# Patient Record
Sex: Female | Born: 1980 | Race: Black or African American | Hispanic: No | Marital: Single | Smoking: Never smoker
Health system: Southern US, Community
[De-identification: ages and names within clinical notes are randomized; demographics above are authoritative.]

## PROBLEM LIST (undated history)

## (undated) DIAGNOSIS — G479 Sleep disorder, unspecified: Secondary | ICD-10-CM

## (undated) DIAGNOSIS — J189 Pneumonia, unspecified organism: Secondary | ICD-10-CM

## (undated) DIAGNOSIS — D219 Benign neoplasm of connective and other soft tissue, unspecified: Secondary | ICD-10-CM

## (undated) DIAGNOSIS — N926 Irregular menstruation, unspecified: Secondary | ICD-10-CM

## (undated) DIAGNOSIS — L309 Dermatitis, unspecified: Secondary | ICD-10-CM

## (undated) DIAGNOSIS — D649 Anemia, unspecified: Secondary | ICD-10-CM

## (undated) HISTORY — DX: Benign neoplasm of connective and other soft tissue, unspecified: D21.9

---

## 2007-09-02 ENCOUNTER — Ambulatory Visit: Payer: Self-pay | Admitting: Internal Medicine

## 2007-10-14 ENCOUNTER — Ambulatory Visit: Payer: Self-pay | Admitting: Family Medicine

## 2007-10-15 ENCOUNTER — Encounter (INDEPENDENT_AMBULATORY_CARE_PROVIDER_SITE_OTHER): Payer: Self-pay | Admitting: Family Medicine

## 2007-10-15 LAB — CONVERTED CEMR LAB
ALT: 21 units/L (ref 0–35)
AST: 27 units/L (ref 0–37)
Basophils Absolute: 0 10*3/uL (ref 0.0–0.1)
Basophils Relative: 0 % (ref 0–1)
Calcium: 9.3 mg/dL (ref 8.4–10.5)
Chloride: 106 meq/L (ref 96–112)
Creatinine, Ser: 0.68 mg/dL (ref 0.40–1.20)
Hemoglobin: 6.7 g/dL — CL (ref 12.0–15.0)
MCHC: 26.3 g/dL — ABNORMAL LOW (ref 30.0–36.0)
Monocytes Absolute: 0.3 10*3/uL (ref 0.1–1.0)
Neutro Abs: 2.5 10*3/uL (ref 1.7–7.7)
Neutrophils Relative %: 54 % (ref 43–77)
RDW: 18.7 % — ABNORMAL HIGH (ref 11.5–15.5)

## 2007-10-19 ENCOUNTER — Ambulatory Visit: Payer: Self-pay | Admitting: *Deleted

## 2008-04-11 ENCOUNTER — Encounter: Admission: RE | Admit: 2008-04-11 | Discharge: 2008-04-11 | Payer: Self-pay | Admitting: Family Medicine

## 2009-03-20 HISTORY — PX: MYOMECTOMY: SHX85

## 2009-03-23 ENCOUNTER — Inpatient Hospital Stay (HOSPITAL_COMMUNITY): Admission: RE | Admit: 2009-03-23 | Discharge: 2009-03-25 | Payer: Self-pay | Admitting: Obstetrics & Gynecology

## 2009-03-23 ENCOUNTER — Encounter: Payer: Self-pay | Admitting: Obstetrics & Gynecology

## 2010-02-07 IMAGING — US US TRANSVAGINAL NON-OB
1 series · 14 of 25 positions shown · non-contrast
Comparison: None.

CLINICAL DATA: Enlarged uterus.  Evaluate for pelvic mass.

TRANSABDOMINAL ULTRASOUND OF PELVIS
TECHNIQUE: Transabdominal ultrasound examination of the pelvis was
performed including evaluation of the uterus, ovaries, adnexal
regions, and pelvic cul-de-sac.

[Series 1: us transvaginal non-ob · 0.43mm/px · 14 of 38 slices shown]
[im 1/38]
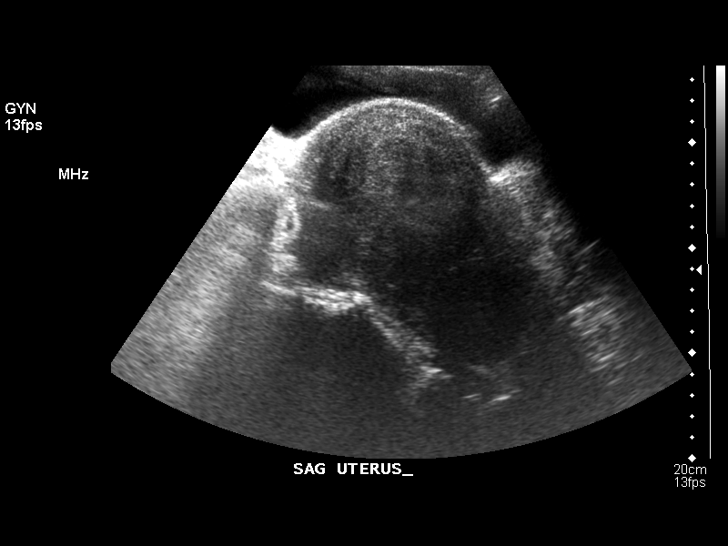
[im 4/38]
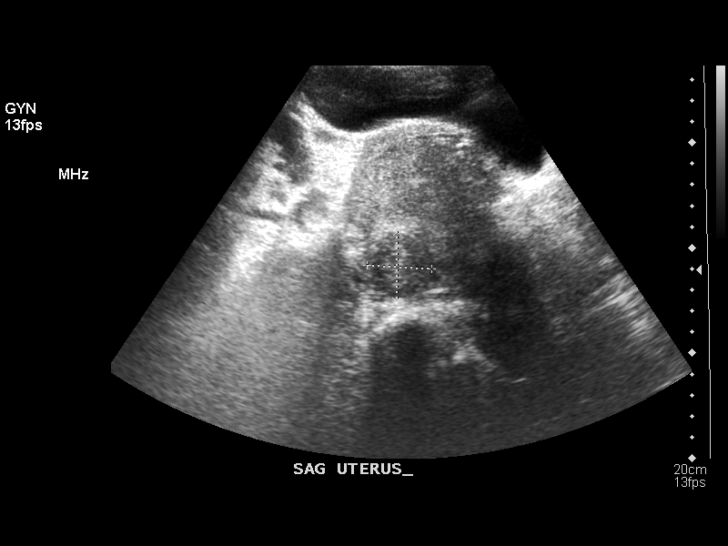
[im 7/38]
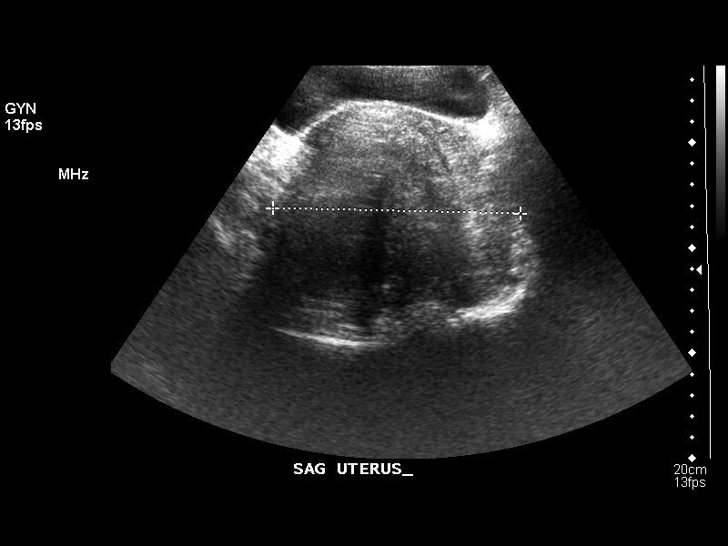
[im 10/38]
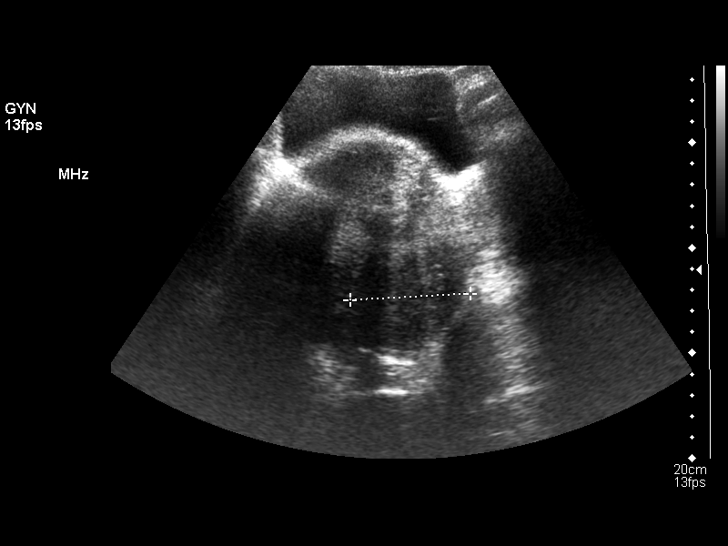
[im 13/38]
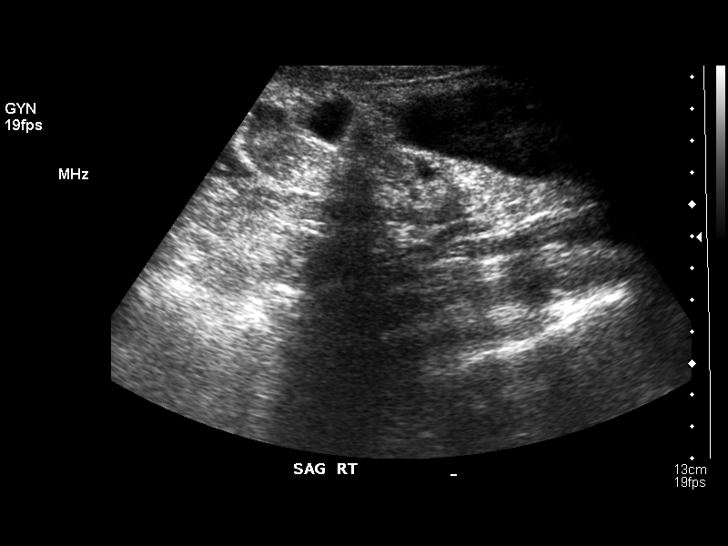
[im 14/38]
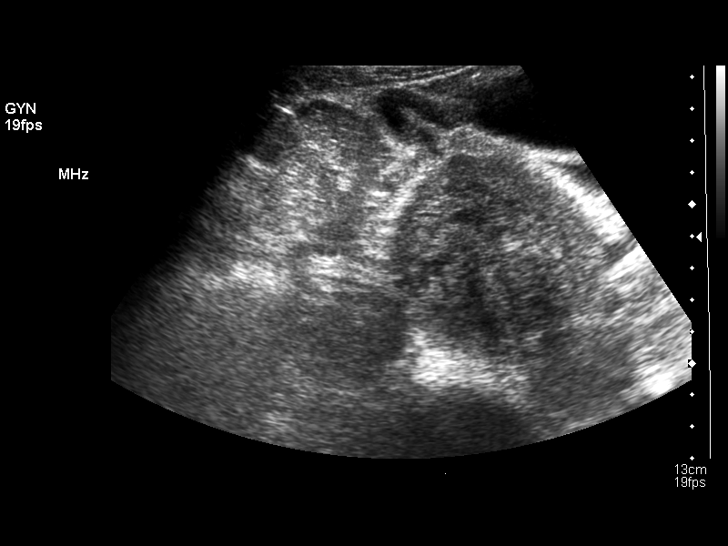
[im 17/38]
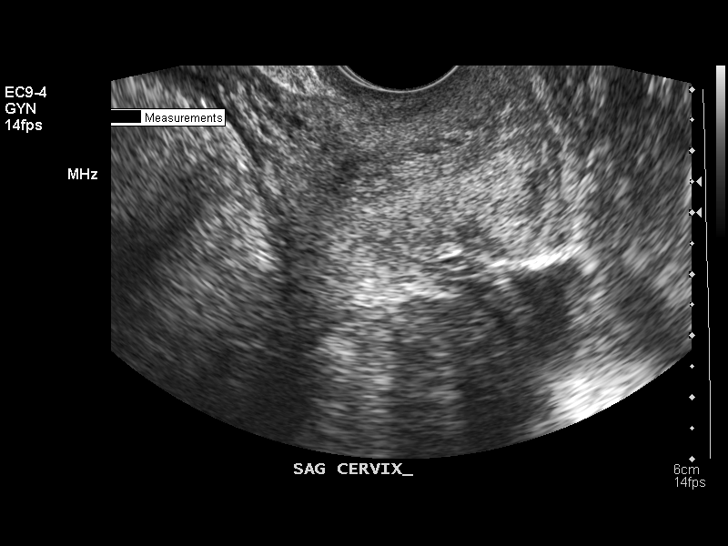
[im 21/38]
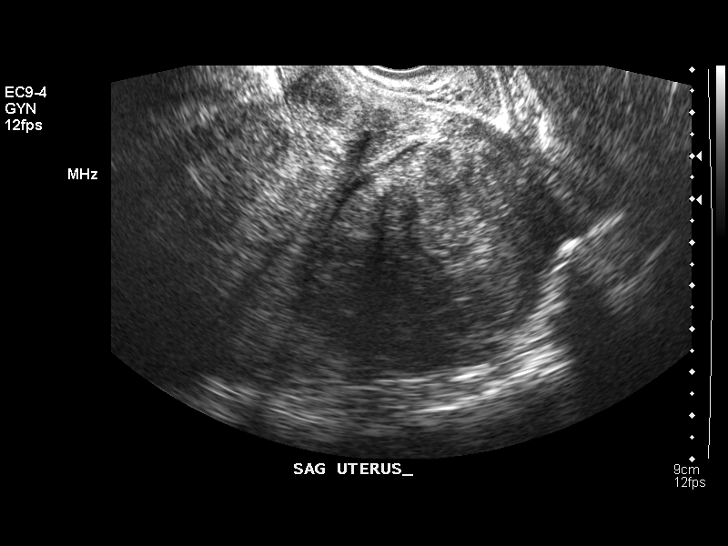
[im 24/38]
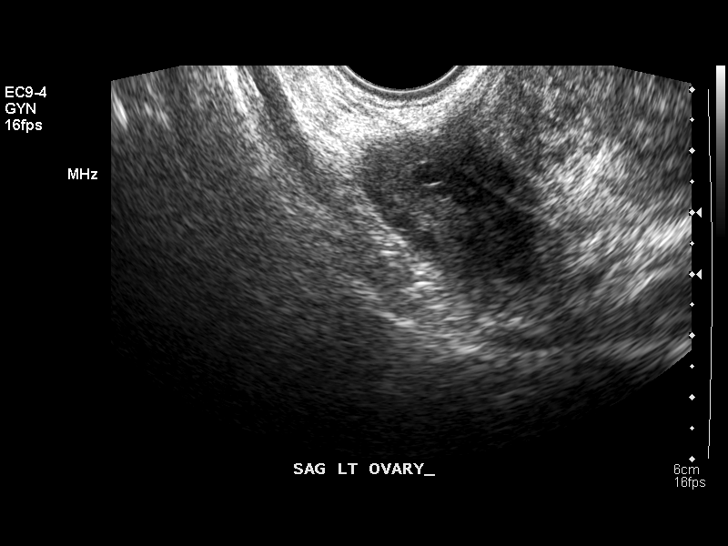
[im 25/38]
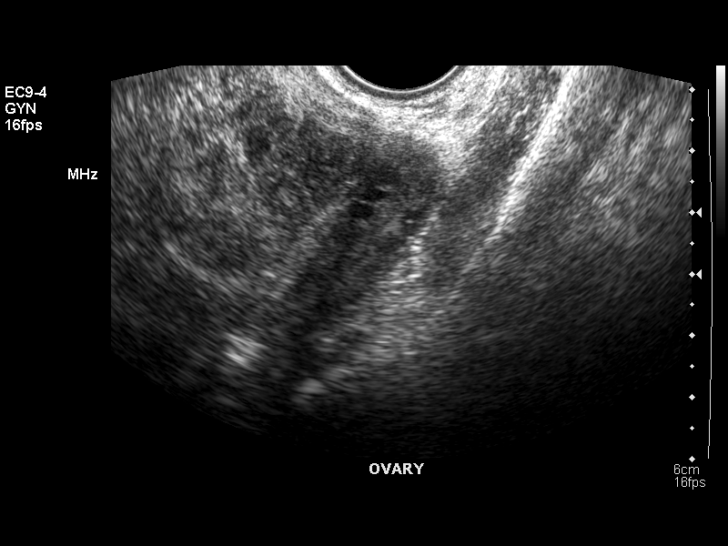
[im 28/38]
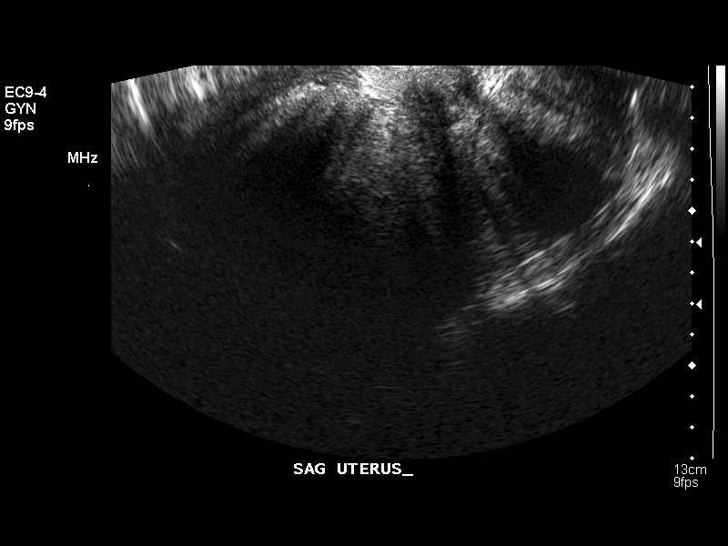
[im 31/38]
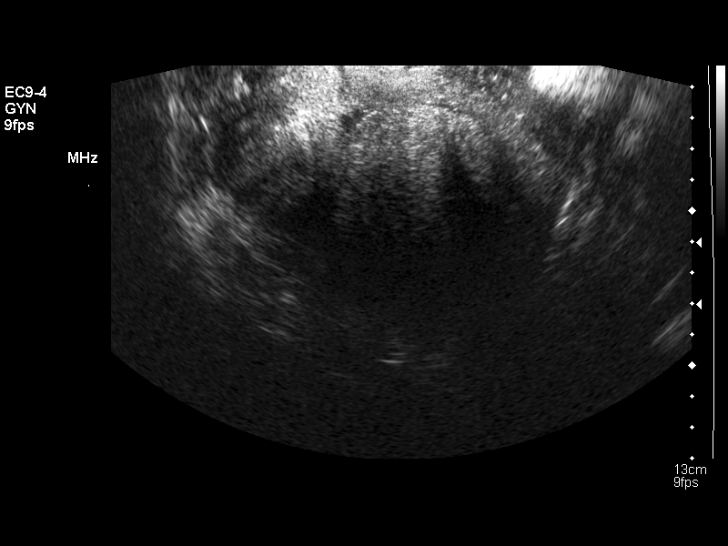
[im 34/38]
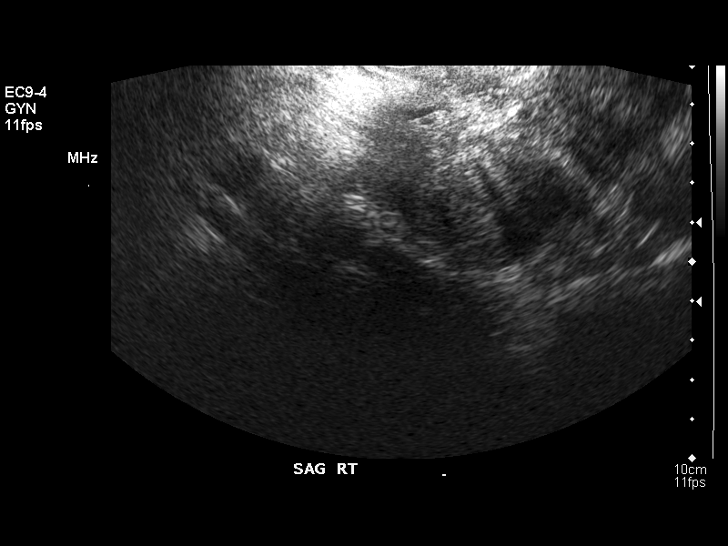
[im 38/38]
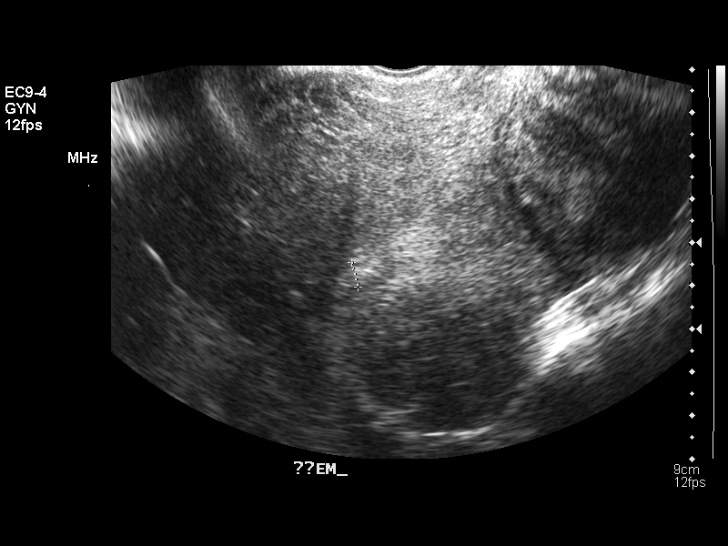

[14 of 25 positions shown; findings below may reference images not displayed]

FINDINGS: The uterus measures 14.8 x 9.4 x 11.8 cm containing
multiple fibroids.  Largest is located in the fundal position
measuring 8.6 x 8.4 x 10 cm.  Right posterior body fibroid measures
3.1 x 3.3 x 3.9 cm.  Lower right fibroid measures 5.1 x 5.2 x
cm.  Anterior left fibroid measures 1.9 x 1.7 x 3 cm.

Endometrial lining is poorly delineated and appears distorted by
the fibroids.

Right ovary not visualized.  Left ovary measures 3.3 x 1.7 x 1.5 cm
containing small follicles without a dominant mass.  No free fluid.
IMPRESSION: Multiple uterine fibroids largest measuring up to 10 cm as
described above.

Poor delineation of the endometrial lining.

Nonvisualization right ovary.

## 2010-05-09 LAB — CBC
HCT: 27.7 % — ABNORMAL LOW (ref 36.0–46.0)
Hemoglobin: 9.1 g/dL — ABNORMAL LOW (ref 12.0–15.0)
MCV: 78.8 fL (ref 78.0–100.0)
RBC: 3.47 MIL/uL — ABNORMAL LOW (ref 3.87–5.11)
RBC: 5.07 MIL/uL (ref 3.87–5.11)
WBC: 10.2 10*3/uL (ref 4.0–10.5)
WBC: 5.1 10*3/uL (ref 4.0–10.5)

## 2010-05-09 LAB — PREGNANCY, URINE: Preg Test, Ur: NEGATIVE

## 2010-05-09 LAB — ABO/RH: ABO/RH(D): A NEG

## 2010-05-09 LAB — TYPE AND SCREEN: Antibody Screen: NEGATIVE

## 2010-06-13 ENCOUNTER — Other Ambulatory Visit: Payer: Self-pay | Admitting: Podiatry

## 2012-06-17 ENCOUNTER — Ambulatory Visit: Payer: Self-pay | Admitting: Obstetrics & Gynecology

## 2012-07-14 ENCOUNTER — Ambulatory Visit: Payer: Self-pay | Admitting: Obstetrics & Gynecology

## 2012-07-29 ENCOUNTER — Ambulatory Visit: Payer: Self-pay | Admitting: Obstetrics & Gynecology

## 2012-08-05 ENCOUNTER — Ambulatory Visit: Payer: Self-pay | Admitting: Obstetrics & Gynecology

## 2012-08-12 ENCOUNTER — Ambulatory Visit (INDEPENDENT_AMBULATORY_CARE_PROVIDER_SITE_OTHER): Payer: 59 | Admitting: Obstetrics & Gynecology

## 2012-08-12 ENCOUNTER — Encounter: Payer: Self-pay | Admitting: Obstetrics & Gynecology

## 2012-08-12 VITALS — BP 108/75 | HR 69 | Temp 98.2°F | Ht 69.0 in | Wt 145.0 lb

## 2012-08-12 DIAGNOSIS — N926 Irregular menstruation, unspecified: Secondary | ICD-10-CM

## 2012-08-12 DIAGNOSIS — N939 Abnormal uterine and vaginal bleeding, unspecified: Secondary | ICD-10-CM

## 2012-08-12 DIAGNOSIS — D259 Leiomyoma of uterus, unspecified: Secondary | ICD-10-CM

## 2012-08-12 LAB — HEMOGLOBIN AND HEMATOCRIT, BLOOD
HCT: 34.5 % — ABNORMAL LOW (ref 36.0–46.0)
Hemoglobin: 11.8 g/dL — ABNORMAL LOW (ref 12.0–15.0)

## 2012-08-12 MED ORDER — NORETHIN-ETH ESTRAD-FE BIPHAS 1 MG-10 MCG / 10 MCG PO TABS: 1.0000 | ORAL_TABLET | Freq: Every day | ORAL | Status: DC

## 2012-08-12 NOTE — Patient Instructions (Signed)
Levonorgestrel intrauterine device (IUD) What is this medicine? LEVONORGESTREL IUD (LEE voe nor jes trel) is a contraceptive (birth control) device. The device is placed inside the uterus by a healthcare professional. It is used to prevent pregnancy and can also be used to treat heavy bleeding that occurs during your period. Depending on the device, it can be used for 3 to 5 years. This medicine may be used for other purposes; ask your health care provider or pharmacist if you have questions. What should I tell my health care provider before I take this medicine? They need to know if you have any of these conditions: -abnormal Pap smear -cancer of the breast, uterus, or cervix -diabetes -endometritis -genital or pelvic infection now or in the past -have more than one sexual partner or your partner has more than one partner -heart disease -history of an ectopic or tubal pregnancy -immune system problems -IUD in place -liver disease or tumor -problems with blood clots or take blood-thinners -use intravenous drugs -uterus of unusual shape -vaginal bleeding that has not been explained -an unusual or allergic reaction to levonorgestrel, other hormones, silicone, or polyethylene, medicines, foods, dyes, or preservatives -pregnant or trying to get pregnant -breast-feeding How should I use this medicine? This device is placed inside the uterus by a health care professional. Talk to your pediatrician regarding the use of this medicine in children. Special care may be needed. Overdosage: If you think you have taken too much of this medicine contact a poison control center or emergency room at once. NOTE: This medicine is only for you. Do not share this medicine with others. What if I miss a dose? This does not apply. What may interact with this medicine? Do not take this medicine with any of the following medications: -amprenavir -bosentan -fosamprenavir This medicine may also interact with  the following medications: -aprepitant -barbiturate medicines for inducing sleep or treating seizures -bexarotene -griseofulvin -medicines to treat seizures like carbamazepine, ethotoin, felbamate, oxcarbazepine, phenytoin, topiramate -modafinil -pioglitazone -rifabutin -rifampin -rifapentine -some medicines to treat HIV infection like atazanavir, indinavir, lopinavir, nelfinavir, tipranavir, ritonavir -St. John's wort -warfarin This list may not describe all possible interactions. Give your health care provider a list of all the medicines, herbs, non-prescription drugs, or dietary supplements you use. Also tell them if you smoke, drink alcohol, or use illegal drugs. Some items may interact with your medicine. What should I watch for while using this medicine? Visit your doctor or health care professional for regular check ups. See your doctor if you or your partner has sexual contact with others, becomes HIV positive, or gets a sexual transmitted disease. This product does not protect you against HIV infection (AIDS) or other sexually transmitted diseases. You can check the placement of the IUD yourself by reaching up to the top of your vagina with clean fingers to feel the threads. Do not pull on the threads. It is a good habit to check placement after each menstrual period. Call your doctor right away if you feel more of the IUD than just the threads or if you cannot feel the threads at all. The IUD may come out by itself. You may become pregnant if the device comes out. If you notice that the IUD has come out use a backup birth control method like condoms and call your health care provider. Using tampons will not change the position of the IUD and are okay to use during your period. What side effects may I notice from receiving this medicine?   Side effects that you should report to your doctor or health care professional as soon as possible: -allergic reactions like skin rash, itching or  hives, swelling of the face, lips, or tongue -fever, flu-like symptoms -genital sores -high blood pressure -no menstrual period for 6 weeks during use -pain, swelling, warmth in the leg -pelvic pain or tenderness -severe or sudden headache -signs of pregnancy -stomach cramping -sudden shortness of breath -trouble with balance, talking, or walking -unusual vaginal bleeding, discharge -yellowing of the eyes or skin Side effects that usually do not require medical attention (report to your doctor or health care professional if they continue or are bothersome): -acne -breast pain -change in sex drive or performance -changes in weight -cramping, dizziness, or faintness while the device is being inserted -headache -irregular menstrual bleeding within first 3 to 6 months of use -nausea This list may not describe all possible side effects. Call your doctor for medical advice about side effects. You may report side effects to FDA at 1-800-FDA-1088. Where should I keep my medicine? This does not apply. NOTE: This sheet is a summary. It may not cover all possible information. If you have questions about this medicine, talk to your doctor, pharmacist, or health care provider.  2013, [employer]. (03/06/2011 1:54:04 PM)  

## 2012-08-12 NOTE — Progress Notes (Signed)
Subjective:     Donna Orozco is a 32 y.o. female here for follow up on fibroids.  Current complaints: blood clots with cycles x 3 months.  Personal health questionnaire reviewed: not asked.   Gynecologic History Patient's last menstrual period was 08/01/2012. Contraception: OCP (estrogen/progesterone) Last Pap: 04/25/2010. Results were: normal Last mammogram: ? 2012. Results were: normal  Obstetric History OB History   Grav Para Term Preterm Abortions TAB SAB Ect Mult Living   0 0 0 0 0 0 0 0 0 0        The following portions of the patient's history were reviewed and updated as appropriate: allergies, current medications, past family history, past medical history, past social history, past surgical history and problem list.  Review of Systems Pertinent items are noted in HPI.    Objective:    Pelvic: uterus approximately 12 weeks size, irregular contour    Assessment:   AUB--L  Plan:   Treatment options reviewed COCP for now Keep menstrual calendar Return in few months

## 2012-08-13 DIAGNOSIS — N939 Abnormal uterine and vaginal bleeding, unspecified: Secondary | ICD-10-CM | POA: Insufficient documentation

## 2012-08-13 DIAGNOSIS — D259 Leiomyoma of uterus, unspecified: Secondary | ICD-10-CM | POA: Insufficient documentation

## 2012-09-02 ENCOUNTER — Encounter: Payer: Self-pay | Admitting: Obstetrics & Gynecology

## 2012-09-02 ENCOUNTER — Ambulatory Visit (INDEPENDENT_AMBULATORY_CARE_PROVIDER_SITE_OTHER): Payer: Managed Care, Other (non HMO) | Admitting: Obstetrics & Gynecology

## 2012-09-02 VITALS — BP 126/77 | HR 71 | Temp 98.3°F | Wt 146.2 lb

## 2012-09-02 DIAGNOSIS — D259 Leiomyoma of uterus, unspecified: Secondary | ICD-10-CM

## 2012-09-02 LAB — HEMOGLOBIN AND HEMATOCRIT, BLOOD
HCT: 32.5 % — ABNORMAL LOW (ref 36.0–46.0)
Hemoglobin: 10.8 g/dL — ABNORMAL LOW (ref 12.0–15.0)

## 2012-09-02 NOTE — Progress Notes (Signed)
.   Subjective:     Donna Orozco is a 32 y.o. female here for a follow up exam from her last visit.  Current complaints: She received a script for Lo Loestrin 3 weeks ago and started taking them.  She started bleeding heavy two days later and Dr. Clearance Coots told her to take 3 pills a day and then restart and take one a day.  She is still lightly spotting.  Personal health questionnaire reviewed: yes.   Gynecologic History Patient's last menstrual period was 08/01/2012. 08/15/2012. Contraception: OCP (estrogen/progesterone) Last Pap: 2012. Results were: normal Last mammogram: 2012. Results were: normal  Obstetric History OB History   Grav Para Term Preterm Abortions TAB SAB Ect Mult Living   0 0 0 0 0 0 0 0 0 0        The following portions of the patient's history were reviewed and updated as appropriate: allergies, current medications, past family history, past medical history, past social history, past surgical history and problem list.  Review of Systems Pertinent items are noted in HPI.    Objective:     No exam today     Assessment:   Uterine fibroids Unscheduled bleeding on COCP  Plan:     COCP BID x 3 months Return in a few months

## 2012-09-06 ENCOUNTER — Encounter: Payer: Self-pay | Admitting: Obstetrics & Gynecology

## 2012-09-08 ENCOUNTER — Encounter: Payer: Self-pay | Admitting: Obstetrics & Gynecology

## 2012-09-09 ENCOUNTER — Encounter: Payer: Self-pay | Admitting: Obstetrics & Gynecology

## 2012-09-22 ENCOUNTER — Ambulatory Visit: Payer: Self-pay | Admitting: Obstetrics & Gynecology

## 2012-11-04 ENCOUNTER — Ambulatory Visit (INDEPENDENT_AMBULATORY_CARE_PROVIDER_SITE_OTHER): Payer: Managed Care, Other (non HMO) | Admitting: Obstetrics & Gynecology

## 2012-11-04 VITALS — BP 105/72 | HR 79 | Temp 98.7°F

## 2012-11-04 DIAGNOSIS — D259 Leiomyoma of uterus, unspecified: Secondary | ICD-10-CM

## 2012-11-04 NOTE — Progress Notes (Signed)
Subjective:     Donna Orozco is a 32 y.o. female here for a routine exam.  Current complaints: Patient is in the office today to follow up on present treatment. Patient is using Lo Loestrin- patient reports she has had a fairly heavy flow with some blood clots. Patient states she feels her cycle is about to end now. Patient has history of heavy bleeding. ( patient had adverse affect to Premarin/Provera)   Personal health questionnaire reviewed: no.   Gynecologic History No LMP recorded. Contraception: OCP (estrogen/progesterone)   Obstetric History OB History  Gravida Para Term Preterm AB SAB TAB Ectopic Multiple Living  0 0 0 0 0 0 0 0 0 0          The following portions of the patient's history were reviewed and updated as appropriate: allergies, current medications, past family history, past medical history, past social history, past surgical history and problem list.  Review of Systems Pertinent items are noted in HPI.    Objective:     No exam today     Assessment:   AUB-L, ?O  Plan:    A therapeutic/diagnostic D&C is planned

## 2012-11-06 ENCOUNTER — Encounter: Payer: Self-pay | Admitting: Obstetrics & Gynecology

## 2012-11-06 NOTE — Patient Instructions (Signed)
Dilation and Curettage or Vacuum Curettage Dilation and curettage (D&C) and vacuum curettage are minor procedures. A D&C involves stretching (dilation) the cervix and scraping (curettage) the inside lining of the womb (uterus). During a D&C, tissue is gently scraped from the inside lining of the uterus. During a vacuum curettage, the lining and tissue in the uterus are removed with the use of gentle suction. Curettage may be performed for diagnostic or therapeutic purposes. As a diagnostic procedure, curettage is performed for the purpose of examining tissues from the uterus. Tissue examination may help determine causes or treatment options for symptoms. A diagnostic curettage may be performed for the following symptoms:  Irregular bleeding in the uterus.  Bleeding with the development of clots.  Spotting between menstrual periods.  Prolonged menstrual periods.  Bleeding after menopause.  No menstrual period (amenorrhea).  A change in size and shape of the uterus. A therapeutic curettage is performed to remove tissue, blood, or a contraceptive device. Therapeutic curettage may be performed for the following conditions:   Removal of an IUD (intrauterine device).  Removal of retained placenta after giving birth. Retained placenta can cause bleeding severe enough to require transfusions or an infection.  Abortion.  Miscarriage.  Removal of polyps inside the uterus.  Removal of uncommon types of fibroids (noncancerous lumps). LET YOUR CAREGIVER KNOW ABOUT:   Allergies to food or medicine.  Medicines taken, including vitamins, herbs, eyedrops, over-the-counter medicines, and creams.  Use of steroids (by mouth or creams).  Previous problems with anesthetics or numbing medicines.  History of bleeding problems or blood clots.  Previous surgery.  Other health problems, including diabetes and kidney problems.  Possibility of pregnancy, if this applies. RISKS AND COMPLICATIONS    Excessive bleeding.  Infection of the uterus.  Damage to the cervix.  Development of scar tissue (adhesions) inside the uterus, later causing abnormal amounts of menstrual bleeding.  Complications from the general anesthetic, if a general anesthetic is used.  Putting a hole (perforation) in the uterus. This is rare. BEFORE THE PROCEDURE   Eat and drink before the procedure only as directed by your caregiver.  Arrange for someone to take you home. PROCEDURE   This procedure may be done in a hospital, outpatient clinic, or caregiver's office.  You may be given a general anesthetic or a local anesthetic in and around the cervix.  You will lie on your back with your legs in stirrups.  There are two ways in which your cervix can be softened and dilated. These include:  Taking a medicine.  Having thin rods (laminaria) inserted into your cervix.  A curved tool (curette) will scrape cells from the inside lining of the uterus and will then be removed. This procedure usually takes about 15 to 30 minutes. AFTER THE PROCEDURE   You will rest in the recovery area until you are stable and are ready to go home.  You will need to have someone take you home.  You may feel sick to your stomach (nauseous) or throw up (vomit) if you had general anesthesia.  You may have a sore throat if a tube was placed in your throat during general anesthesia.  You may have light cramping and bleeding for 2 days to 2 weeks after the procedure.  Your uterus needs to make a new lining after the procedure. This may make your next period late. Document Released: 02/03/2005 Document Revised: 04/28/2011 Document Reviewed: 09/01/2008 [facility] Patient Information 2014 [place], [place].

## 2012-11-08 ENCOUNTER — Other Ambulatory Visit: Payer: Self-pay | Admitting: *Deleted

## 2012-11-09 ENCOUNTER — Encounter (HOSPITAL_COMMUNITY): Payer: Self-pay | Admitting: Pharmacist

## 2012-11-12 ENCOUNTER — Ambulatory Visit (HOSPITAL_COMMUNITY): Payer: PRIVATE HEALTH INSURANCE | Admitting: Anesthesiology

## 2012-11-12 ENCOUNTER — Encounter (HOSPITAL_COMMUNITY): Payer: Self-pay | Admitting: *Deleted

## 2012-11-12 ENCOUNTER — Ambulatory Visit (HOSPITAL_COMMUNITY)
Admission: RE | Admit: 2012-11-12 | Discharge: 2012-11-12 | Disposition: A | Discharge status: Home / Self Care | Payer: PRIVATE HEALTH INSURANCE | Source: Ambulatory Visit | Attending: Obstetrics & Gynecology | Admitting: Obstetrics & Gynecology

## 2012-11-12 ENCOUNTER — Encounter (HOSPITAL_COMMUNITY)
Admission: RE | Disposition: A | Discharge status: Home / Self Care | Payer: Self-pay | Source: Ambulatory Visit | Attending: Obstetrics & Gynecology

## 2012-11-12 ENCOUNTER — Encounter (HOSPITAL_COMMUNITY): Payer: Self-pay | Admitting: Anesthesiology

## 2012-11-12 DIAGNOSIS — N920 Excessive and frequent menstruation with regular cycle: Secondary | ICD-10-CM

## 2012-11-12 DIAGNOSIS — N938 Other specified abnormal uterine and vaginal bleeding: Secondary | ICD-10-CM | POA: Insufficient documentation

## 2012-11-12 DIAGNOSIS — D259 Leiomyoma of uterus, unspecified: Secondary | ICD-10-CM

## 2012-11-12 DIAGNOSIS — N949 Unspecified condition associated with female genital organs and menstrual cycle: Secondary | ICD-10-CM | POA: Insufficient documentation

## 2012-11-12 DIAGNOSIS — Z30430 Encounter for insertion of intrauterine contraceptive device: Secondary | ICD-10-CM

## 2012-11-12 DIAGNOSIS — D250 Submucous leiomyoma of uterus: Secondary | ICD-10-CM | POA: Insufficient documentation

## 2012-11-12 DIAGNOSIS — N939 Abnormal uterine and vaginal bleeding, unspecified: Secondary | ICD-10-CM

## 2012-11-12 HISTORY — PX: HYSTEROSCOPY W/D&C: SHX1775

## 2012-11-12 HISTORY — DX: Anemia, unspecified: D64.9

## 2012-11-12 LAB — CBC
HCT: 36.3 % (ref 36.0–46.0)
Hemoglobin: 11.9 g/dL — ABNORMAL LOW (ref 12.0–15.0)
MCHC: 32.8 g/dL (ref 30.0–36.0)
Platelets: 245 10*3/uL (ref 150–400)
RBC: 4.66 MIL/uL (ref 3.87–5.11)
WBC: 5.2 10*3/uL (ref 4.0–10.5)

## 2012-11-12 LAB — PREGNANCY, URINE: Preg Test, Ur: NEGATIVE

## 2012-11-12 SURGERY — DILATATION AND CURETTAGE /HYSTEROSCOPY
Anesthesia: General | Site: Uterus | Wound class: Clean Contaminated

## 2012-11-12 MED ORDER — FENTANYL CITRATE 0.05 MG/ML IJ SOLN: 25.0000 ug | INTRAMUSCULAR | Status: DC | PRN

## 2012-11-12 MED ORDER — ONDANSETRON HCL 4 MG/2ML IJ SOLN
INTRAMUSCULAR | Status: AC
  Filled 2012-11-12: qty 2

## 2012-11-12 MED ORDER — GLYCOPYRROLATE 0.2 MG/ML IJ SOLN
INTRAMUSCULAR | Status: AC
  Filled 2012-11-12: qty 1

## 2012-11-12 MED ORDER — MEPERIDINE HCL 25 MG/ML IJ SOLN: 6.2500 mg | INTRAMUSCULAR | Status: DC | PRN

## 2012-11-12 MED ORDER — LIDOCAINE HCL 1 % IJ SOLN
INTRAMUSCULAR | Status: DC | PRN
  Administered 2012-11-12: 10 mL

## 2012-11-12 MED ORDER — GLYCINE 1.5 % IR SOLN
Status: DC | PRN
  Administered 2012-11-12: 3000 mL

## 2012-11-12 MED ORDER — LIDOCAINE HCL (CARDIAC) 20 MG/ML IV SOLN
INTRAVENOUS | Status: AC
  Filled 2012-11-12: qty 5

## 2012-11-12 MED ORDER — MIDAZOLAM HCL 2 MG/2ML IJ SOLN
INTRAMUSCULAR | Status: AC
  Filled 2012-11-12: qty 2

## 2012-11-12 MED ORDER — KETOROLAC TROMETHAMINE 30 MG/ML IJ SOLN: 15.0000 mg | Freq: Once | INTRAMUSCULAR | Status: DC | PRN

## 2012-11-12 MED ORDER — FENTANYL CITRATE 0.05 MG/ML IJ SOLN
INTRAMUSCULAR | Status: DC | PRN
  Administered 2012-11-12 (×2): 50 ug via INTRAVENOUS

## 2012-11-12 MED ORDER — FENTANYL CITRATE 0.05 MG/ML IJ SOLN
INTRAMUSCULAR | Status: AC
  Filled 2012-11-12: qty 2

## 2012-11-12 MED ORDER — GLYCOPYRROLATE 0.2 MG/ML IJ SOLN
INTRAMUSCULAR | Status: DC | PRN
  Administered 2012-11-12: 0.2 mg via INTRAVENOUS

## 2012-11-12 MED ORDER — LACTATED RINGERS IV SOLN
INTRAVENOUS | Status: DC
  Administered 2012-11-12 (×2): via INTRAVENOUS

## 2012-11-12 MED ORDER — KETOROLAC TROMETHAMINE 30 MG/ML IJ SOLN
INTRAMUSCULAR | Status: AC
  Filled 2012-11-12: qty 1

## 2012-11-12 MED ORDER — OXYCODONE-ACETAMINOPHEN 2.5-325 MG PO TABS: 1.0000 | ORAL_TABLET | ORAL | Status: DC | PRN

## 2012-11-12 MED ORDER — LIDOCAINE HCL (CARDIAC) 20 MG/ML IV SOLN
INTRAVENOUS | Status: DC | PRN
  Administered 2012-11-12: 20 mg via INTRAVENOUS
  Administered 2012-11-12: 80 mg via INTRAVENOUS

## 2012-11-12 MED ORDER — PROPOFOL 10 MG/ML IV EMUL
INTRAVENOUS | Status: AC
  Filled 2012-11-12: qty 20

## 2012-11-12 MED ORDER — ONDANSETRON HCL 4 MG/2ML IJ SOLN: 4.0000 mg | Freq: Once | INTRAMUSCULAR | Status: DC | PRN

## 2012-11-12 MED ORDER — KETOROLAC TROMETHAMINE 30 MG/ML IJ SOLN
INTRAMUSCULAR | Status: DC | PRN
  Administered 2012-11-12: 30 mg via INTRAVENOUS

## 2012-11-12 MED ORDER — PROPOFOL 10 MG/ML IV BOLUS
INTRAVENOUS | Status: DC | PRN
  Administered 2012-11-12: 130 mg via INTRAVENOUS

## 2012-11-12 MED ORDER — MIDAZOLAM HCL 2 MG/2ML IJ SOLN
INTRAMUSCULAR | Status: DC | PRN
  Administered 2012-11-12: 2 mg via INTRAVENOUS

## 2012-11-12 MED ORDER — ONDANSETRON HCL 4 MG/2ML IJ SOLN
INTRAMUSCULAR | Status: DC | PRN
  Administered 2012-11-12: 4 mg via INTRAVENOUS

## 2012-11-12 SURGICAL SUPPLY — 16 items
CANISTER SUCTION 2500CC (MISCELLANEOUS) ×2 IMPLANT
CATH ROBINSON RED A/P 16FR (CATHETERS) ×2 IMPLANT
CLOTH BEACON ORANGE TIMEOUT ST (SAFETY) ×2 IMPLANT
CONTAINER PREFILL 10% NBF 60ML (FORM) ×4 IMPLANT
DRESSING TELFA 8X3 (GAUZE/BANDAGES/DRESSINGS) ×2 IMPLANT
ELECT REM PT RETURN 9FT ADLT (ELECTROSURGICAL) ×2
ELECTRODE REM PT RTRN 9FT ADLT (ELECTROSURGICAL) ×1 IMPLANT
GLOVE BIO SURGEON STRL SZ 6.5 (GLOVE) ×2 IMPLANT
GOWN STRL REIN XL XLG (GOWN DISPOSABLE) ×4 IMPLANT
NEEDLE SPNL 20GX3.5 QUINCKE YW (NEEDLE) IMPLANT
PACK HYSTEROSCOPY LF (CUSTOM PROCEDURE TRAY) ×2 IMPLANT
PAD OB MATERNITY 4.3X12.25 (PERSONAL CARE ITEMS) ×2 IMPLANT
SCRUB PCMX 4 OZ (MISCELLANEOUS) ×2 IMPLANT
TOWEL OR 17X24 6PK STRL BLUE (TOWEL DISPOSABLE) ×4 IMPLANT
WATER STERILE IRR 1000ML POUR (IV SOLUTION) ×2 IMPLANT
mirena ×2 IMPLANT

## 2012-11-12 NOTE — H&P (Signed)
  Chief Complaint: 32 y.o. gravida who presents for a D&C/hysteroscopy  Details of Present Illness: The patient carries a diagnosis of uterine fibroids with secondary abnormal uterine bleeding.  The bleeding has been refractory to attempts to manage medically.  Ht 5\' 9"  (1.753 m)  Wt 63.504 kg (140 lb)  BMI 20.67 kg/m2  Past Medical History  Diagnosis Date  . Fibroids    History   Social History  . Marital Status: Single    Spouse Name: N/A    Number of Children: 0  . Years of Education: N/A   Occupational History  . Not on file.   Social History Main Topics  . Smoking status: Never Smoker   . Smokeless tobacco: Never Used  . Alcohol Use: No  . Drug Use: No  . Sexual Activity: Yes    Partners: Male    Birth Control/ Protection: OCP   Other Topics Concern  . Not on file   Social History Narrative  . No narrative on file   Family History  Problem Relation Age of Onset  . Hypertension Mother   . Diabetes Sister   . Sarcoidosis Brother     ? testing stages  . Heart disease Maternal Grandmother   . Dementia Maternal Grandmother     Pertinent items are noted in HPI.  Pre-Op Diagnosis: Abnormal Uterine Bleeding 16109   Planned Procedure: Procedure(s): DILATATION AND CURETTAGE /HYSTEROSCOPY  I have reviewed the patient's history and have completed the physical exam and Donna Orozco is acceptable for surgery.  Roseanna Rainbow, MD 11/12/2012 9:43 AM

## 2012-11-12 NOTE — Anesthesia Postprocedure Evaluation (Signed)
Anesthesia Post Note  Patient: Donna Orozco  Procedure(s) Performed: Procedure(s) (LRB): DILATATION AND CURETTAGE /HYSTEROSCOPY (N/A)  Anesthesia type: General  Patient location: PACU  Post pain: Pain level controlled  Post assessment: Post-op Vital signs reviewed  Last Vitals:  Filed Vitals:   11/12/12 0954  BP: 119/84  Pulse: 65  Temp: 36.6 C  Resp: 20    Post vital signs: Reviewed  Level of consciousness: sedated  Complications: No apparent anesthesia complications

## 2012-11-12 NOTE — Op Note (Signed)
Preoperative diagnosis: Abnormal uterine bleeding, uterine fibroids  Postoperative diagnosis: Same, submucous myoma  Procedure: Diagnostic/therapeutic dilatation and curettage, hysteroscopy, Mirena intrauterine device insertion  Surgeon: Antionette Char A  Anesthesia: Laryngeal mask airway, paracervical block  Estimated blood loss: Minimal  Urine output: 50 ml  IV Fluids: per Anesthesiology  Complications: None  Specimen: PATHOLOGY  Operative Findings: There was a myoma with a submucous component involving the left wall of the uterine fundus.  The right tubal ostium was seen.  The left tubal ostium was obscured by the myoma.  The endometrium was overall thin.  A gritty texture was noted during the curettage.    Description of procedure:   The patient was taken to the operating room and placed on the operating table in the semi-lithotomy position in [place] stirrups.  Examination under anesthesia was performed.  The patient was prepped and draped in the usual manner.  After a time-out had been completed, a speculum was placed in the vagina.  The anterior lip of the cervix was grasped with a single-toothed tenaculum.    10 cc of 1% lidocaine were injected at 4 and 7 o'clock to produce a paracervical block.  The uterine cavity sounded to 11 cm.  The endocervical canal was dilated with Shawnie Pons dilators.  A 5 mm diagnostic hysteroscope with Glycine as the distending medium was used to perform a diagnostic hysteroscopy.  The findings are noted above.    The hysteroscope was removed.  A small, Sims curette was used to perform an endometrial curettage. The Mirena intrauterine device was inserted.   All the instruments were removed from the vagina.  Final instrument counts were correct.  The patient was taken to the PACU in stable condition.

## 2012-11-12 NOTE — Addendum Note (Signed)
Addendum created 11/12/12 1341 by Suella Grove, CRNA   Modules edited: Anesthesia Medication Administration

## 2012-11-12 NOTE — Anesthesia Preprocedure Evaluation (Signed)
Anesthesia Evaluation    Reviewed: Allergy & Precautions, H&P , NPO status , Patient's Chart, lab work & pertinent test results  Airway Mallampati: I TM Distance: >3 FB Neck ROM: full    Dental no notable dental hx. (+) Teeth Intact   Pulmonary neg pulmonary ROS,    Pulmonary exam normal       Cardiovascular negative cardio ROS      Neuro/Psych negative neurological ROS  negative psych ROS   GI/Hepatic negative GI ROS, Neg liver ROS,   Endo/Other  negative endocrine ROS  Renal/GU negative Renal ROS  negative genitourinary   Musculoskeletal negative musculoskeletal ROS (+)   Abdominal Normal abdominal exam  (+)   Peds  Hematology negative hematology ROS (+)   Anesthesia Other Findings   Reproductive/Obstetrics negative OB ROS                           Anesthesia Physical Anesthesia Plan  ASA: I  Anesthesia Plan: General   Post-op Pain Management:    Induction: Intravenous  Airway Management Planned: LMA  Additional Equipment:   Intra-op Plan:   Post-operative Plan:   Informed Consent: I have reviewed the patients History and Physical, chart, labs and discussed the procedure including the risks, benefits and alternatives for the proposed anesthesia with the patient or authorized representative who has indicated his/her understanding and acceptance.     Plan Discussed with: CRNA and Surgeon  Anesthesia Plan Comments:         Anesthesia Quick Evaluation  

## 2012-11-12 NOTE — Transfer of Care (Signed)
Immediate Anesthesia Transfer of Care Note  Patient: Donna Orozco  Procedure(s) Performed: Procedure(s) with comments: DILATATION AND CURETTAGE /HYSTEROSCOPY (N/A) - with insertion of mirena IUD  Patient Location: PACU  Anesthesia Type:General  Level of Consciousness: sedated  Airway & Oxygen Therapy: Patient Spontanous Breathing and Patient connected to nasal cannula oxygen  Post-op Assessment: Report given to PACU RN  Post vital signs: Reviewed  Complications: No apparent anesthesia complications

## 2012-11-15 ENCOUNTER — Encounter: Payer: Self-pay | Admitting: Obstetrics & Gynecology

## 2012-11-16 ENCOUNTER — Encounter (HOSPITAL_COMMUNITY): Payer: Self-pay | Admitting: Obstetrics & Gynecology

## 2012-12-13 ENCOUNTER — Encounter: Payer: Self-pay | Admitting: Obstetrics & Gynecology

## 2012-12-13 ENCOUNTER — Ambulatory Visit (INDEPENDENT_AMBULATORY_CARE_PROVIDER_SITE_OTHER): Payer: Managed Care, Other (non HMO) | Admitting: Obstetrics & Gynecology

## 2012-12-13 VITALS — BP 122/77 | HR 80 | Temp 98.3°F | Ht 69.0 in | Wt 140.2 lb

## 2012-12-13 DIAGNOSIS — D259 Leiomyoma of uterus, unspecified: Secondary | ICD-10-CM

## 2012-12-13 DIAGNOSIS — Z3202 Encounter for pregnancy test, result negative: Secondary | ICD-10-CM

## 2012-12-13 LAB — HEMOGLOBIN AND HEMATOCRIT, BLOOD: HCT: 35.5 % — ABNORMAL LOW (ref 36.0–46.0)

## 2012-12-13 MED ORDER — FLUCONAZOLE 150 MG PO TABS: 150.0000 mg | ORAL_TABLET | ORAL | Status: DC

## 2012-12-13 MED ORDER — NORETHINDRONE ACETATE 5 MG PO TABS: 5.0000 mg | ORAL_TABLET | Freq: Every day | ORAL | Status: DC

## 2012-12-13 NOTE — Progress Notes (Signed)
.   Subjective:     Donna Orozco is a 32 y.o. female here for a problem exam.  Current complaints: Patient had a hysteroscopy with a D&C on 11/12/12.  Since her procedure she has had light bleeding. She is also having vaginal itching and irritation for the last couple of days.   Personal health questionnaire reviewed: no.   Gynecologic History No LMP recorded. Contraception: none Last Pap: 04/2010. Results were: normal Last mammogram: N/A  Obstetric History OB History  Gravida Para Term Preterm AB SAB TAB Ectopic Multiple Living  0 0 0 0 0 0 0 0 0 0          The following portions of the patient's history were reviewed and updated as appropriate: allergies, current medications, past family history, past medical history, past social history, past surgical history and problem list.  Review of Systems Pertinent items are noted in HPI.    Objective:     SPEC: IUD strings present Bimanual:  Uterus RV, irregular contour, NT     Assessment:   Postoperative exam OK Pathology reviewed with pt--benign polyps Plan:   Aygestin x 3 months Return in a few months

## 2012-12-14 LAB — WET PREP BY MOLECULAR PROBE
Gardnerella vaginalis: NEGATIVE
Trichomonas vaginosis: NEGATIVE

## 2012-12-23 ENCOUNTER — Other Ambulatory Visit: Payer: Self-pay

## 2012-12-30 ENCOUNTER — Ambulatory Visit: Payer: Managed Care, Other (non HMO) | Admitting: Obstetrics & Gynecology

## 2013-01-24 ENCOUNTER — Encounter: Payer: Self-pay | Admitting: Obstetrics & Gynecology

## 2013-02-24 ENCOUNTER — Ambulatory Visit (INDEPENDENT_AMBULATORY_CARE_PROVIDER_SITE_OTHER): Payer: BC Managed Care – PPO | Admitting: Obstetrics & Gynecology

## 2013-02-24 VITALS — BP 146/82 | HR 69 | Temp 98.6°F | Wt 147.0 lb

## 2013-02-24 DIAGNOSIS — N926 Irregular menstruation, unspecified: Secondary | ICD-10-CM

## 2013-02-24 DIAGNOSIS — R102 Pelvic and perineal pain: Secondary | ICD-10-CM

## 2013-02-24 DIAGNOSIS — N939 Abnormal uterine and vaginal bleeding, unspecified: Secondary | ICD-10-CM

## 2013-02-24 DIAGNOSIS — D259 Leiomyoma of uterus, unspecified: Secondary | ICD-10-CM

## 2013-02-24 LAB — POCT URINALYSIS DIPSTICK
BILIRUBIN UA: NEGATIVE
GLUCOSE UA: NEGATIVE
Ketones, UA: NEGATIVE
Nitrite, UA: NEGATIVE
Protein, UA: NEGATIVE
SPEC GRAV UA: 1.015
Urobilinogen, UA: NEGATIVE
pH, UA: 8

## 2013-02-24 NOTE — Progress Notes (Signed)
Subjective:     Donna Orozco is a 33 y.o. female here for a follow up exam.  Current complaints: Pt states that she is having some lower abdominal pain.  Pt is not sure if this is related to medication that was recently started. Pt is following up on IUD and Aygestin use.  Pt reports small amount of spotting since placement of IUD. Personal health questionnaire reviewed: yes.   Gynecologic History No LMP recorded. Patient is not currently having periods (Reason: IUD). Contraception: IUD Last Pap: 10/2012-D&C with pathology. Results were: normal   Obstetric History OB History  Gravida Para Term Preterm AB SAB TAB Ectopic Multiple Living  0 0 0 0 0 0 0 0 0 0          The following portions of the patient's history were reviewed and updated as appropriate: allergies, current medications, past family history, past medical history, past social history, past surgical history and problem list.  Review of Systems Pertinent items are noted in HPI.    Objective:     Pelvic U/S by w/3-D imaging--appropriately positioned IUD     Assessment:      Patient Active Problem List   Diagnosis Date Noted  . Abnormal uterine bleeding 08/13/2012  . Leiomyoma of uterus, unspecified 08/13/2012   Bleeding controlled; occasional pelvic pain  Plan:   Return in 6 mths

## 2013-02-25 ENCOUNTER — Encounter: Payer: Self-pay | Admitting: Obstetrics & Gynecology

## 2013-03-14 ENCOUNTER — Encounter: Payer: Self-pay | Admitting: Obstetrics & Gynecology

## 2013-03-14 ENCOUNTER — Other Ambulatory Visit: Payer: Self-pay | Admitting: Obstetrics & Gynecology

## 2013-03-14 DIAGNOSIS — D259 Leiomyoma of uterus, unspecified: Secondary | ICD-10-CM

## 2013-03-14 MED ORDER — NORETHINDRONE ACETATE 5 MG PO TABS: 5.0000 mg | ORAL_TABLET | Freq: Every day | ORAL | Status: DC

## 2013-08-25 ENCOUNTER — Encounter: Payer: Self-pay | Admitting: Obstetrics & Gynecology

## 2013-08-25 ENCOUNTER — Ambulatory Visit (INDEPENDENT_AMBULATORY_CARE_PROVIDER_SITE_OTHER): Payer: BC Managed Care – PPO | Admitting: Obstetrics & Gynecology

## 2013-08-25 VITALS — BP 128/85 | HR 75 | Temp 98.0°F | Ht 69.0 in | Wt 141.0 lb

## 2013-08-25 DIAGNOSIS — D259 Leiomyoma of uterus, unspecified: Secondary | ICD-10-CM

## 2013-08-25 DIAGNOSIS — Z01419 Encounter for gynecological examination (general) (routine) without abnormal findings: Secondary | ICD-10-CM

## 2013-08-25 LAB — HEMOGLOBIN AND HEMATOCRIT, BLOOD
HCT: 39.7 % (ref 36.0–46.0)
HEMOGLOBIN: 13.8 g/dL (ref 12.0–15.0)

## 2013-08-25 NOTE — Patient Instructions (Signed)

## 2013-08-25 NOTE — Progress Notes (Signed)
Subjective:     Donna Orozco is a 33 y.o. female here for a routine exam.  Current complaints: none.    Personal health questionnaire:  Is patient Ashkenazi Jewish, have a family history of breast and/or ovarian cancer: no Is there a family history of uterine cancer diagnosed at age < 17, gastrointestinal cancer, urinary tract cancer, family member who is a Field seismologist syndrome-associated carrier: no Is the patient overweight and hypertensive, family history of diabetes, personal history of gestational diabetes or PCOS: no Is patient over 74, have PCOS,  family history of premature CHD under age 80, diabetes, smoke, have hypertension or peripheral artery disease:  no   Gynecologic History Patient's last menstrual period was 08/19/2013. Contraception: IUD   Obstetric History OB History  Gravida Para Term Preterm AB SAB TAB Ectopic Multiple Living  0 0 0 0 0 0 0 0 0 0         Past Medical History  Diagnosis Date  . Fibroids   . Anemia     Past Surgical History  Procedure Laterality Date  . Myomectomy  03/2009  . Hysteroscopy w/d&c N/A 11/12/2012    Procedure: DILATATION AND CURETTAGE /HYSTEROSCOPY;  Surgeon: [name], MD;  Location: [facility];  Service: Gynecology;  Laterality: N/A;  with insertion of mirena IUD    Current outpatient prescriptions:ACZONE 5 % topical gel, Apply 1 application topically 2 (two) times daily. , Disp: , Rfl: ;  Aspirin-Acetaminophen-Caffeine (GOODY HEADACHE PO), Take by mouth as needed., Disp: , Rfl: ;  ibuprofen (ADVIL,MOTRIN) 200 MG tablet, Take 200 mg by mouth every 6 (six) hours as needed for pain., Disp: , Rfl: ;  Multiple Vitamins-Minerals (WOMENS MULTIVITAMIN PLUS PO), Take 1 tablet by mouth daily., Disp: , Rfl:  Ferrous Sulfate (IRON) 325 (65 FE) MG TABS, Take 1 tablet by mouth daily., Disp: , Rfl: ;  fish oil-omega-3 fatty acids 1000 MG capsule, Take 1 g by mouth daily., Disp: , Rfl:  No Known Allergies  History  Substance Use Topics  .  Smoking status: Never Smoker   . Smokeless tobacco: Never Used  . Alcohol Use: No    Family History  Problem Relation Age of Onset  . Hypertension Mother   . Diabetes Sister   . Sarcoidosis Brother     ? testing stages  . Heart disease Maternal Grandmother   . Dementia Maternal Grandmother       Review of Systems  Constitutional: negative for fatigue and weight loss Respiratory: negative for cough and wheezing Cardiovascular: negative for chest pain, fatigue and palpitations Gastrointestinal: negative for abdominal pain and change in bowel habits Musculoskeletal:negative for myalgias Neurological: negative for gait problems and tremors Behavioral/Psych: negative for abusive relationship, depression Endocrine: negative for temperature intolerance   Genitourinary:negative for abnormal menstrual periods, genital lesions, hot flashes, sexual problems and vaginal discharge Integument/breast: negative for breast lump, breast tenderness, nipple discharge and skin lesion(s)    Objective:       BP 128/85  Pulse 75  Temp(Src) 98 F (36.7 C)  Ht 5\' 9"  (1.753 m)  Wt 63.957 kg (141 lb)  BMI 20.81 kg/m2  LMP 08/19/2013 General:   alert  Skin:   no rash or abnormalities  Lungs:   clear to auscultation bilaterally  Heart:   regular rate and rhythm, S1, S2 normal, no murmur, click, rub or gallop  Breasts:   normal without suspicious masses, skin or nipple changes or axillary nodes  Abdomen:  normal findings: no organomegaly, soft, non-tender and  no hernia  Pelvis:  External genitalia: normal general appearance Urinary system: urethral meatus normal and bladder without fullness, nontender Vaginal: normal without tenderness, induration, scant heme Cervix: normal appearance Adnexa: normal bimanual exam Uterus: retroverted and non-tender, large posterior, LUS myoma; deviates cervix-->right  Limited U/S: IUD seen Lab Review  Labs reviewed yes Radiologic studies reviewed  no    Assessment:    Fibroid uterus, symptoms controlled at present   Plan:    Orders Placed This Encounter  Procedures  . HIV antibody  . Hemoglobin and hematocrit, blood  . RPR   Possible management options include: myomectomy--repeat Follow up as needed or in 6 mths

## 2013-08-26 LAB — PAP IG AND HPV HIGH-RISK: HPV DNA High Risk: NOT DETECTED

## 2013-08-26 LAB — RPR

## 2013-08-26 LAB — HIV ANTIBODY (ROUTINE TESTING W REFLEX): HIV: NONREACTIVE

## 2014-02-13 ENCOUNTER — Encounter: Payer: Self-pay | Admitting: *Deleted

## 2014-02-14 ENCOUNTER — Encounter: Payer: Self-pay | Admitting: Obstetrics & Gynecology

## 2014-03-02 ENCOUNTER — Ambulatory Visit: Payer: BC Managed Care – PPO | Admitting: Obstetrics & Gynecology

## 2014-05-01 ENCOUNTER — Ambulatory Visit: Payer: BLUE CROSS/BLUE SHIELD | Attending: Gynecologic Oncology | Admitting: Gynecologic Oncology

## 2014-05-01 ENCOUNTER — Encounter: Payer: Self-pay | Admitting: Gynecologic Oncology

## 2014-05-01 VITALS — BP 133/80 | HR 76 | Temp 98.4°F | Resp 20 | Ht 69.0 in | Wt 144.0 lb

## 2014-05-01 DIAGNOSIS — Z791 Long term (current) use of non-steroidal anti-inflammatories (NSAID): Secondary | ICD-10-CM | POA: Diagnosis not present

## 2014-05-01 DIAGNOSIS — Z7982 Long term (current) use of aspirin: Secondary | ICD-10-CM | POA: Diagnosis not present

## 2014-05-01 DIAGNOSIS — D259 Leiomyoma of uterus, unspecified: Secondary | ICD-10-CM

## 2014-05-01 DIAGNOSIS — Z79899 Other long term (current) drug therapy: Secondary | ICD-10-CM | POA: Insufficient documentation

## 2014-05-01 NOTE — Patient Instructions (Signed)
We will call you regarding surgery. Please call us sooner with any questions or concerns.

## 2014-05-01 NOTE — Progress Notes (Signed)
Consult Note: Gyn-Onc  Consult was requested by Dr. Delsa Sale for the evaluation of Donna Orozco 34 y.o. female with symptomatic uterine fibroids.  CC:  Chief Complaint  Patient presents with  . Fibroids    Assessment/Plan:  Ms. Donna Orozco  is a 34 y.o.  year old with symptomatic uterine fibroids refractory to medical management who desires fertility preserving treatment with a myomectomy.  I agree with the surgical plan. I agree with the intended minimally invasive approach with robotic assisted myomectomy. I discussed with the patient that my role during the surgery would be to assist with complex rectoperineal dissection around by all structures. I discussed risks during surgery including damage to major vascular structures causing blood loss and potentially blood transfusion and risk for damage to adjacent structures such as bowel, ureter, bladder. The patient expressed some concern about the robotic approach and expressed interest in potentially having the surgery performed by laparotomy. I discussed with her that I would defer that decision to Dr. Delsa Sale. I explained that laparotomy is associated with increased morbidity for the patient including risk for infection, blood loss, hospitalization, wound complications and delayed return to function. I explained that I would be happy to assist in the surgery in either a minimally invasive or open approach.  The patient is undergoing an MRI to better delineate the location and size of her multiple fibroids. We have surgery scheduled for her on 04/21/2014.   HPI: Donna Orozco is a 34 year old gravida 0 who is seen in consultation at the request of Dr. Delsa Sale for symptomatic uterine fibroids. The patient has a lungs any history of uterine fibroids and is status post port post laparotomy and abdominal myomectomy in the past. She reformed her fibroids postoperatively and has persistent symptoms of heavy menses, intermenstrual  bleeding, and mass effect and pressure symptoms. She has failed medical management of these including oral contraceptive pills and upper just releasing IUD which is in place currently. She was contemplating a definitive hysterectomy however as she has not yet attempted childbearing is now considering repeat myomectomy. She is scheduled to undergo this procedure on 05/11/2014 with Dr. Delsa Sale. My assistance in the surgery has been requested given the presence of cervical myoma and the concern for close proximity to vital structures such as the uterine arteries and ureter.   Current Meds:  Outpatient Encounter Prescriptions as of 05/01/2014  Medication Sig  . Aspirin-Acetaminophen-Caffeine (GOODY HEADACHE PO) Take by mouth as needed.  . Ferrous Sulfate (IRON) 325 (65 FE) MG TABS Take 1 tablet by mouth daily.  Marland Kitchen ibuprofen (ADVIL,MOTRIN) 200 MG tablet Take 200 mg by mouth every 6 (six) hours as needed for pain.  . Multiple Vitamins-Minerals (WOMENS MULTIVITAMIN PLUS PO) Take 1 tablet by mouth daily.  . [DISCONTINUED] desonide (DESOWEN) 0.05 % lotion Apply topically.  . [DISCONTINUED] norethindrone (MICRONOR,CAMILA,ERRIN) 0.35 MG tablet Take 1 tablet by mouth.  . [DISCONTINUED] ACZONE 5 % topical gel Apply 1 application topically 2 (two) times daily.   . [DISCONTINUED] estrogens, conjugated, (PREMARIN) 1.25 MG tablet Take by mouth.  . [DISCONTINUED] fish oil-omega-3 fatty acids 1000 MG capsule Take 1 g by mouth daily.  . [DISCONTINUED] Lactobacillus (DIGESTIVE HEALTH PROBIOTIC) CAPS Take 2 tablets by mouth.  . [DISCONTINUED] Multiple Vitamin (MULTI-VITAMINS) TABS Take by mouth.    Allergy: No Known Allergies  Social Hx:   History   Social History  . Marital Status: Single    Spouse Name: N/A  . Number of Children: 0  .  Years of Education: N/A   Occupational History  . Not on file.   Social History Main Topics  . Smoking status: Never Smoker   . Smokeless tobacco: Never Used  .  Alcohol Use: No  . Drug Use: No  . Sexual Activity:    Partners: Male    Birth Control/ Protection: IUD   Other Topics Concern  . Not on file   Social History Narrative    Past Surgical Hx:  Past Surgical History  Procedure Laterality Date  . Myomectomy  03/2009  . Hysteroscopy w/d&c N/A 11/12/2012    Procedure: DILATATION AND CURETTAGE /HYSTEROSCOPY;  Surgeon: [name], MD;  Location: [facility];  Service: Gynecology;  Laterality: N/A;  with insertion of mirena IUD    Past Medical Hx:  Past Medical History  Diagnosis Date  . Fibroids   . Anemia     Past Gynecological History:  G0, prior myomectomy (laparotomy)  No LMP recorded.  Family Hx:  Family History  Problem Relation Age of Onset  . Hypertension Mother   . Diabetes Sister   . Sarcoidosis Brother     ? testing stages  . Heart disease Maternal Grandmother   . Dementia Maternal Grandmother     Review of Systems:  Constitutional  Feels well,    ENT Normal appearing ears and nares bilaterally Skin/Breast  No rash, sores, jaundice, itching, dryness Cardiovascular  No chest pain, shortness of breath, or edema  Pulmonary  No cough or wheeze.  Gastro Intestinal  No nausea, vomitting, or diarrhoea. No bright red blood per rectum, no abdominal pain, change in bowel movement, or constipation.  Genito Urinary  No frequency, urgency, dysuria, see HPi Musculo Skeletal  No myalgia, arthralgia, joint swelling or pain  Neurologic  No weakness, numbness, change in gait,  Psychology  No depression, anxiety, insomnia.   Vitals:  Blood pressure 133/80, pulse 76, temperature 98.4 F (36.9 C), temperature source Oral, resp. rate 20, height 5\' 9"  (1.753 m), weight 144 lb (65.318 kg).  Physical Exam: WD in NAD Neck  Supple NROM, without any enlargements.  Lymph Node Survey No cervical supraclavicular or inguinal adenopathy Cardiovascular  Pulse normal rate, regularity and rhythm. S1 and S2 normal.  Lungs   Clear to auscultation bilateraly, without wheezes/crackles/rhonchi. Good air movement.  Skin  No rash/lesions/breakdown  Psychiatry  Alert and oriented to person, place, and time  Abdomen  Normoactive bowel sounds, abdomen soft, non-tender and very thin without evidence of hernia. Back No CVA tenderness Genito Urinary  Vulva/vagina: Normal external female genitalia.  No lesions. No discharge or bleeding.  Bladder/urethra:  No lesions or masses, well supported bladder  Vagina: normal  Cervix: Distorted to the right. Normal appearing, no lesions.  Uterus: bulky, 12 week-14 week size. Cervical/lower uterine segment fibroid palpable and smooth on the left.  Adnexa: no discrete palpable ovarian masses. Rectal  Good tone, no masses no cul de sac nodularity.  Extremities  No bilateral cyanosis, clubbing or edema.  Donaciano Eva, MD   05/01/2014, 4:27 PM

## 2014-05-08 NOTE — Patient Instructions (Addendum)
Donna Orozco  05/08/2014   Your procedure is scheduled on: Thursday 05/11/14   Report to [facility] Main  Entrance and follow signs to               [place] at 08:00 AM.   Call this number if you have problems the morning of surgery [phone]   Remember:  Clear liquids for 24 hours before surgery.     Do not drink liquids :After Midnight.    Take these medicines the morning of surgery with A SIP OF WATER: NONE                               You may not have any metal on your body including hair pins and              piercings  Do not wear jewelry, make-up, lotions, powders or perfumes.             Do not wear nail polish.  Do not shave  48 hours prior to surgery.              Men may shave face and neck.  Do not bring valuables to the hospital. [place].  Contacts, dentures or bridgework may not be worn into surgery.  Leave suitcase in the car. After surgery it may be brought to your room.   Patients discharged the day of surgery will not be allowed to drive home.   _____________________________________________________________________                                                                                                          [facility] - Preparing for Surgery Before surgery, you can play an important role.  Because skin is not sterile, your skin needs to be as free of germs as possible.  You can reduce the number of germs on your skin by washing with CHG (chlorahexidine gluconate) soap before surgery.  CHG is an antiseptic cleaner which kills germs and bonds with the skin to continue killing germs even after washing. Please DO NOT use if you have an allergy to CHG or antibacterial soaps.  If your skin becomes reddened/irritated stop using the CHG and inform your nurse when you arrive at Short Stay. Do not shave (including legs and underarms) for at least 48 hours prior to the first  CHG shower.  You may shave your face/neck. Please follow these instructions carefully:  1.  Shower with CHG Soap the night before surgery and the  morning of Surgery.  2.  If you choose to wash your hair, wash your hair first as usual with your  normal  shampoo.  3.  After you shampoo, rinse your hair and body thoroughly to remove the  shampoo.  4.  Use CHG as you would any other liquid soap.  You can apply chg directly  to the skin and wash                       Gently with a scrungie or clean washcloth.  5.  Apply the CHG Soap to your body ONLY FROM THE NECK DOWN.   Do not use on face/ open                           Wound or open sores. Avoid contact with eyes, ears mouth and genitals (private parts).                       Wash face,  Genitals (private parts) with your normal soap.             6.  Wash thoroughly, paying special attention to the area where your surgery  will be performed.  7.  Thoroughly rinse your body with warm water from the neck down.  8.  DO NOT shower/wash with your normal soap after using and rinsing off  the CHG Soap.                9.  Pat yourself dry with a clean towel.            10.  Wear clean pajamas.            11.  Place clean sheets on your bed the night of your first shower and do not  sleep with pets. Day of Surgery : Do not apply any lotions/deodorants the morning of surgery.  Please wear clean clothes to the hospital/surgery center.  FAILURE TO FOLLOW THESE INSTRUCTIONS MAY RESULT IN THE CANCELLATION OF YOUR SURGERY   PATIENT SIGNATURE_________________________________    ________________________________________________________________________  WHAT IS A BLOOD TRANSFUSION? Blood Transfusion Information  A transfusion is the replacement of blood or some of its parts. Blood is made up of multiple cells which provide different functions.  Red blood cells carry oxygen and are used for blood loss replacement.  White blood  cells fight against infection.  Platelets control bleeding.  Plasma helps clot blood.  Other blood products are available for specialized needs, such as hemophilia or other clotting disorders. BEFORE THE TRANSFUSION  Who gives blood for transfusions?   Healthy volunteers who are fully evaluated to make sure their blood is safe. This is blood bank blood. Transfusion therapy is the safest it has ever been in the practice of medicine. Before blood is taken from a donor, a complete history is taken to make sure that person has no history of diseases nor engages in risky social behavior (examples are intravenous drug use or sexual activity with multiple partners). The donor's travel history is screened to minimize risk of transmitting infections, such as malaria. The donated blood is tested for signs of infectious diseases, such as HIV and hepatitis. The blood is then tested to be sure it is compatible with you in order to minimize the chance of a transfusion reaction. If you or a relative donates blood, this is often done in anticipation of surgery and is not appropriate for emergency situations. It takes many days to process the donated blood. RISKS AND COMPLICATIONS Although transfusion therapy is very safe and saves many lives, the main dangers of transfusion include:  1. Getting an infectious disease. 2. Developing a transfusion reaction.  This is an allergic reaction to something in the blood you were given. Every precaution is taken to prevent this. The decision to have a blood transfusion has been considered carefully by your caregiver before blood is given. Blood is not given unless the benefits outweigh the risks. AFTER THE TRANSFUSION  Right after receiving a blood transfusion, you will usually feel much better and more energetic. This is especially true if your red blood cells have gotten low (anemic). The transfusion raises the level of the red blood cells which carry oxygen, and this  usually causes an energy increase.  The nurse administering the transfusion will monitor you carefully for complications. HOME CARE INSTRUCTIONS  No special instructions are needed after a transfusion. You may find your energy is better. Speak with your caregiver about any limitations on activity for underlying diseases you may have. SEEK MEDICAL CARE IF:   Your condition is not improving after your transfusion.  You develop redness or irritation at the intravenous (IV) site. SEEK IMMEDIATE MEDICAL CARE IF:  Any of the following symptoms occur over the next 12 hours:  Shaking chills.  You have a temperature by mouth above 102 F (38.9 C), not controlled by medicine.  Chest, back, or muscle pain.  People around you feel you are not acting correctly or are confused.  Shortness of breath or difficulty breathing.  Dizziness and fainting.  You get a rash or develop hives.  You have a decrease in urine output.  Your urine turns a dark color or changes to pink, red, or brown. Any of the following symptoms occur over the next 10 days:  You have a temperature by mouth above 102 F (38.9 C), not controlled by medicine.  Shortness of breath.  Weakness after normal activity.  The white part of the eye turns yellow (jaundice).  You have a decrease in the amount of urine or are urinating less often.  Your urine turns a dark color or changes to pink, red, or brown. Document Released: 02/01/2000 Document Revised: 04/28/2011 Document Reviewed: 09/20/2007 Exit[employer] Patient Information 2014 [employer], [place].  _______________________________________________________________________   CLEAR LIQUID DIET   Foods Allowed                                                                     Foods Excluded  Coffee and tea, regular and decaf                             liquids that you cannot  Plain Jell-O in any flavor                                             see through such as: Fruit  ices (not with fruit pulp)                                     milk, soups, orange juice  Iced Popsicles  All solid food Carbonated beverages, regular and diet                                    Cranberry, grape and apple juices Sports drinks like Gatorade Lightly seasoned clear broth or consume(fat free) Sugar, honey syrup _____________________________________________________________________    Incentive Spirometer  An incentive spirometer is a tool that can help keep your lungs clear and active. This tool measures how well you are filling your lungs with each breath. Taking long deep breaths may help reverse or decrease the chance of developing breathing (pulmonary) problems (especially infection) following:  A long period of time when you are unable to move or be active. BEFORE THE PROCEDURE   If the spirometer includes an indicator to show your best effort, your nurse or respiratory therapist will set it to a desired goal.  If possible, sit up straight or lean slightly forward. Try not to slouch.  Hold the incentive spirometer in an upright position. INSTRUCTIONS FOR USE  3. Sit on the edge of your bed if possible, or sit up as far as you can in bed or on a chair. 4. Hold the incentive spirometer in an upright position. 5. Breathe out normally. 6. Place the mouthpiece in your mouth and seal your lips tightly around it. 7. Breathe in slowly and as deeply as possible, raising the piston or the ball toward the top of the column. 8. Hold your breath for 3-5 seconds or for as long as possible. Allow the piston or ball to fall to the bottom of the column. 9. Remove the mouthpiece from your mouth and breathe out normally. 10. Rest for a few seconds and repeat Steps 1 through 7 at least 10 times every 1-2 hours when you are awake. Take your time and take a few normal breaths between deep breaths. 11. The spirometer may include an indicator to  show your best effort. Use the indicator as a goal to work toward during each repetition. 12. After each set of 10 deep breaths, practice coughing to be sure your lungs are clear. If you have an incision (the cut made at the time of surgery), support your incision when coughing by placing a pillow or rolled up towels firmly against it. Once you are able to get out of bed, walk around indoors and cough well. You may stop using the incentive spirometer when instructed by your caregiver.  RISKS AND COMPLICATIONS  Take your time so you do not get dizzy or light-headed.  If you are in pain, you may need to take or ask for pain medication before doing incentive spirometry. It is harder to take a deep breath if you are having pain. AFTER USE  Rest and breathe slowly and easily.  It can be helpful to keep track of a log of your progress. Your caregiver can provide you with a simple table to help with this. If you are using the spirometer at home, follow these instructions: [place] IF:   You are having difficultly using the spirometer.  You have trouble using the spirometer as often as instructed.  Your pain medication is not giving enough relief while using the spirometer.  You develop fever of 100.5 F (38.1 C) or higher. SEEK IMMEDIATE MEDICAL CARE IF:   You cough up bloody sputum that had not been present before.  You develop fever of 102 F (38.9 C) or  greater.  You develop worsening pain at or near the incision site. MAKE SURE YOU:   Understand these instructions.  Will watch your condition.  Will get help right away if you are not doing well or get worse. Document Released: 06/16/2006 Document Revised: 04/28/2011 Document Reviewed: 08/17/2006 [facility] Patient Information 2014 [place], [place].   ________________________________________________________________________

## 2014-05-09 ENCOUNTER — Encounter (HOSPITAL_COMMUNITY): Payer: Self-pay

## 2014-05-09 ENCOUNTER — Encounter (HOSPITAL_COMMUNITY)
Admission: RE | Admit: 2014-05-09 | Discharge: 2014-05-09 | Disposition: A | Discharge status: Home / Self Care | Payer: BLUE CROSS/BLUE SHIELD | Source: Ambulatory Visit | Attending: Obstetrics & Gynecology | Admitting: Obstetrics & Gynecology

## 2014-05-09 DIAGNOSIS — D649 Anemia, unspecified: Secondary | ICD-10-CM | POA: Diagnosis not present

## 2014-05-09 DIAGNOSIS — D259 Leiomyoma of uterus, unspecified: Secondary | ICD-10-CM | POA: Diagnosis present

## 2014-05-09 DIAGNOSIS — D252 Subserosal leiomyoma of uterus: Secondary | ICD-10-CM | POA: Diagnosis not present

## 2014-05-09 DIAGNOSIS — Z975 Presence of (intrauterine) contraceptive device: Secondary | ICD-10-CM | POA: Diagnosis not present

## 2014-05-09 DIAGNOSIS — D250 Submucous leiomyoma of uterus: Secondary | ICD-10-CM | POA: Diagnosis not present

## 2014-05-09 DIAGNOSIS — N1330 Unspecified hydronephrosis: Secondary | ICD-10-CM | POA: Diagnosis not present

## 2014-05-09 DIAGNOSIS — D251 Intramural leiomyoma of uterus: Secondary | ICD-10-CM | POA: Diagnosis not present

## 2014-05-09 DIAGNOSIS — N938 Other specified abnormal uterine and vaginal bleeding: Secondary | ICD-10-CM | POA: Diagnosis present

## 2014-05-09 HISTORY — DX: Dermatitis, unspecified: L30.9

## 2014-05-09 HISTORY — DX: Sleep disorder, unspecified: G47.9

## 2014-05-09 HISTORY — DX: Irregular menstruation, unspecified: N92.6

## 2014-05-09 LAB — COMPREHENSIVE METABOLIC PANEL
ALBUMIN: 4.3 g/dL (ref 3.5–5.2)
ALT: 25 U/L (ref 0–35)
AST: 30 U/L (ref 0–37)
Alkaline Phosphatase: 55 U/L (ref 39–117)
Anion gap: 8 (ref 5–15)
BUN: 8 mg/dL (ref 6–23)
CALCIUM: 8.8 mg/dL (ref 8.4–10.5)
CO2: 27 mmol/L (ref 19–32)
Chloride: 103 mmol/L (ref 96–112)
Creatinine, Ser: 0.65 mg/dL (ref 0.50–1.10)
GFR calc Af Amer: >90 mL/min (ref >90)
Glucose, Bld: 73 mg/dL (ref 70–99)
Potassium: 3.4 mmol/L — ABNORMAL LOW (ref 3.5–5.1)
SODIUM: 138 mmol/L (ref 135–145)
Total Bilirubin: 0.5 mg/dL (ref 0.3–1.2)
Total Protein: 7.2 g/dL (ref 6.0–8.3)

## 2014-05-09 LAB — CBC WITH DIFFERENTIAL/PLATELET
BASOS ABS: 0 10*3/uL (ref 0.0–0.1)
Basophils Relative: 0 % (ref 0–1)
EOS ABS: 0 10*3/uL (ref 0.0–0.7)
Eosinophils Relative: 1 % (ref 0–5)
HEMATOCRIT: 34.5 % — AB (ref 36.0–46.0)
Hemoglobin: 11.4 g/dL — ABNORMAL LOW (ref 12.0–15.0)
LYMPHS ABS: 1.3 10*3/uL (ref 0.7–4.0)
Lymphocytes Relative: 29 % (ref 12–46)
MCH: 27.8 pg (ref 26.0–34.0)
MCHC: 33 g/dL (ref 30.0–36.0)
MCV: 84.1 fL (ref 78.0–100.0)
MONO ABS: 0.3 10*3/uL (ref 0.1–1.0)
MONOS PCT: 7 % (ref 3–12)
NEUTROS ABS: 2.9 10*3/uL (ref 1.7–7.7)
Neutrophils Relative %: 63 % (ref 43–77)
Platelets: 228 10*3/uL (ref 150–400)
RBC: 4.1 MIL/uL (ref 3.87–5.11)
RDW: 13.4 % (ref 11.5–15.5)
WBC: 4.5 10*3/uL (ref 4.0–10.5)

## 2014-05-09 LAB — URINALYSIS, ROUTINE W REFLEX MICROSCOPIC
Bilirubin Urine: NEGATIVE
Glucose, UA: NEGATIVE mg/dL
Ketones, ur: NEGATIVE mg/dL
Leukocytes, UA: NEGATIVE
NITRITE: NEGATIVE
PH: 5.5 (ref 5.0–8.0)
Protein, ur: NEGATIVE mg/dL
Specific Gravity, Urine: 1.026 (ref 1.005–1.030)
UROBILINOGEN UA: 0.2 mg/dL (ref 0.0–1.0)

## 2014-05-09 LAB — URINE MICROSCOPIC-ADD ON

## 2014-05-09 LAB — ABO/RH: ABO/RH(D): A NEG

## 2014-05-09 LAB — PREGNANCY, URINE: Preg Test, Ur: NEGATIVE

## 2014-05-10 NOTE — H&P (Signed)
Subjective:  Donna Orozco is a 34 y.o.  para 0 with a moderately-enlarged, symptomatic fibroid uterus.  There is a remote history of multiple open abdominal myomectomies.  She has secondary abnormal uterine bleeding.  Recently the heavy menses has worsened, refractory to attempts to medical management with a Nuva ring and subsequent Mirena IUD insertion.   Evaluation to date: pelvic ultrasound: positive and pelvic MRI that show a large 8 cm, posterior myoma with a submucous component and multiple smaller masses.  There is secondary right-sided, mild hydronephrosis.  Pertinent Gyn History:  Menses heavy flow Contraception: IUD Blood transfusions: none Last pap: normal Date: 7/15  Patient Active Problem List   Diagnosis Date Noted  . Abnormal uterine bleeding 08/13/2012  . Leiomyoma of uterus, unspecified 08/13/2012   Past Medical History  Diagnosis Date  . Fibroids   . Anemia   . Eczema   . Difficulty sleeping   . Irregular menses     Past Surgical History  Procedure Laterality Date  . Myomectomy  03/2009  . Hysteroscopy w/d&c N/A 11/12/2012    Procedure: DILATATION AND CURETTAGE /HYSTEROSCOPY;  Surgeon: [name], MD;  Location: [facility];  Service: Gynecology;  Laterality: N/A;  with insertion of mirena IUD    No prescriptions prior to admission   No Known Allergies  History  Substance Use Topics  . Smoking status: Never Smoker   . Smokeless tobacco: Never Used  . Alcohol Use: No    Family History  Problem Relation Age of Onset  . Hypertension Mother   . Diabetes Sister   . Sarcoidosis Brother     ? testing stages  . Heart disease Maternal Grandmother   . Dementia Maternal Grandmother      Review of Systems Constitutional: negative for fatigue and weight loss Respiratory: negative for cough and wheezing Cardiovascular: negative for chest pain, fatigue and palpitations Gastrointestinal: negative for abdominal pain and change in bowel habits Genitourinary:  positive for heavy menses Integument/breast: negative for nipple discharge Musculoskeletal:negative for myalgias Neurological: negative for gait problems and tremors Behavioral/Psych: negative for abusive relationship, depression Endocrine: negative for temperature intolerance    Objective:   Vital signs in last 24 hours:   General:   alert  Skin:   no rash or abnormalities  Lungs:   clear to auscultation bilaterally  Heart:   regular rate and rhythm, S1, S2 normal, no murmur, click, rub or gallop  Breasts:   normal without suspicious masses, skin or nipple changes or axillary nodes  Abdomen:  normal findings: no organomegaly, soft, non-tender and no hernia  Pelvis:  External genitalia: normal general appearance Urinary system: urethral meatus normal and bladder without fullness, nontender Vaginal: normal without tenderness, induration or masses Cervix: normal appearance Adnexa: normal bimanual exam Uterus: fills pelvis-->deviates cervix to the right; irregular in contour, approximately 12 - 14 weeks size in aggregate      Assessment/Plan:   AUB--L (SM)--refractory to medical management  I had a lengthy discussion with the patient regarding her bleeding and consideration for a minimally invasive versus open approach for removal of the fibroids.  She elects to proceed with multiple open abdominal myomectomies.   Procedure, risks, reasons, benefits and complications (including injury to bowel, bladder, major blood vessel, ureter, bleeding, possibility of transfusion, infection, thromboembolism or fistula formation) were reviewed in detail. She was also counseled regarding the possibility of hysterectomy in the event of uncontrollable hemorrhage.  Consent was signed and preop testing was ordered.  Instructions were reviewed, including  NPO after midnight.

## 2014-05-11 ENCOUNTER — Ambulatory Visit (HOSPITAL_COMMUNITY): Payer: BLUE CROSS/BLUE SHIELD | Admitting: Anesthesiology

## 2014-05-11 ENCOUNTER — Observation Stay (HOSPITAL_COMMUNITY)
Admission: RE | Admit: 2014-05-11 | Discharge: 2014-05-12 | Disposition: A | Discharge status: Home / Self Care | Payer: BLUE CROSS/BLUE SHIELD | Source: Ambulatory Visit | Attending: Obstetrics & Gynecology | Admitting: Obstetrics & Gynecology

## 2014-05-11 ENCOUNTER — Encounter (HOSPITAL_COMMUNITY)
Admission: RE | Disposition: A | Discharge status: Home / Self Care | Payer: Self-pay | Source: Ambulatory Visit | Attending: Obstetrics & Gynecology

## 2014-05-11 ENCOUNTER — Encounter (HOSPITAL_COMMUNITY): Payer: Self-pay | Admitting: *Deleted

## 2014-05-11 DIAGNOSIS — D219 Benign neoplasm of connective and other soft tissue, unspecified: Secondary | ICD-10-CM | POA: Diagnosis present

## 2014-05-11 DIAGNOSIS — D251 Intramural leiomyoma of uterus: Secondary | ICD-10-CM | POA: Diagnosis not present

## 2014-05-11 DIAGNOSIS — D259 Leiomyoma of uterus, unspecified: Secondary | ICD-10-CM | POA: Diagnosis not present

## 2014-05-11 DIAGNOSIS — N1330 Unspecified hydronephrosis: Secondary | ICD-10-CM | POA: Insufficient documentation

## 2014-05-11 DIAGNOSIS — D252 Subserosal leiomyoma of uterus: Secondary | ICD-10-CM | POA: Insufficient documentation

## 2014-05-11 DIAGNOSIS — D649 Anemia, unspecified: Secondary | ICD-10-CM | POA: Insufficient documentation

## 2014-05-11 DIAGNOSIS — D250 Submucous leiomyoma of uterus: Secondary | ICD-10-CM | POA: Insufficient documentation

## 2014-05-11 DIAGNOSIS — Z975 Presence of (intrauterine) contraceptive device: Secondary | ICD-10-CM | POA: Insufficient documentation

## 2014-05-11 DIAGNOSIS — N939 Abnormal uterine and vaginal bleeding, unspecified: Secondary | ICD-10-CM

## 2014-05-11 HISTORY — PX: LAPAROTOMY: SHX154

## 2014-05-11 LAB — TYPE AND SCREEN
ABO/RH(D): A NEG
ANTIBODY SCREEN: NEGATIVE

## 2014-05-11 SURGERY — LAPAROTOMY, EXPLORATORY
Anesthesia: General

## 2014-05-11 MED ORDER — MAGNESIUM CITRATE PO SOLN: 1.0000 | Freq: Once | ORAL | Status: AC | PRN

## 2014-05-11 MED ORDER — MAGNESIUM HYDROXIDE 400 MG/5ML PO SUSP: 30.0000 mL | Freq: Every day | ORAL | Status: DC | PRN

## 2014-05-11 MED ORDER — LIDOCAINE HCL (CARDIAC) 20 MG/ML IV SOLN
INTRAVENOUS | Status: AC
  Filled 2014-05-11: qty 5

## 2014-05-11 MED ORDER — SIMETHICONE 80 MG PO CHEW
80.0000 mg | CHEWABLE_TABLET | Freq: Four times a day (QID) | ORAL | Status: DC | PRN
  Filled 2014-05-11: qty 1

## 2014-05-11 MED ORDER — HYDROMORPHONE HCL 2 MG/ML IJ SOLN
INTRAMUSCULAR | Status: AC
  Filled 2014-05-11: qty 1

## 2014-05-11 MED ORDER — NEOSTIGMINE METHYLSULFATE 10 MG/10ML IV SOLN
INTRAVENOUS | Status: AC
  Filled 2014-05-11: qty 1

## 2014-05-11 MED ORDER — BUPIVACAINE LIPOSOME 1.3 % IJ SUSP
20.0000 mL | Freq: Once | INTRAMUSCULAR | Status: AC
  Administered 2014-05-11: 20 mL
  Filled 2014-05-11: qty 20

## 2014-05-11 MED ORDER — BISACODYL 10 MG RE SUPP: 10.0000 mg | Freq: Every day | RECTAL | Status: DC | PRN

## 2014-05-11 MED ORDER — LIDOCAINE HCL (CARDIAC) 20 MG/ML IV SOLN
INTRAVENOUS | Status: DC | PRN
  Administered 2014-05-11: 50 mg via INTRAVENOUS

## 2014-05-11 MED ORDER — KETOROLAC TROMETHAMINE 30 MG/ML IJ SOLN
30.0000 mg | Freq: Once | INTRAMUSCULAR | Status: AC
  Administered 2014-05-11: 30 mg via INTRAVENOUS

## 2014-05-11 MED ORDER — SODIUM CHLORIDE 0.9 % IJ SOLN
INTRAMUSCULAR | Status: DC | PRN
  Administered 2014-05-11: 20 mL

## 2014-05-11 MED ORDER — KCL IN DEXTROSE-NACL 20-5-0.45 MEQ/L-%-% IV SOLN
INTRAVENOUS | Status: DC
  Administered 2014-05-11 – 2014-05-12 (×2): via INTRAVENOUS
  Filled 2014-05-11 (×3): qty 1000

## 2014-05-11 MED ORDER — ONDANSETRON HCL 4 MG/2ML IJ SOLN
INTRAMUSCULAR | Status: AC
Start: 2014-05-11 — End: 2014-05-11
  Filled 2014-05-11: qty 2

## 2014-05-11 MED ORDER — KETOROLAC TROMETHAMINE 30 MG/ML IJ SOLN
30.0000 mg | Freq: Four times a day (QID) | INTRAMUSCULAR | Status: DC
  Administered 2014-05-11 – 2014-05-12 (×4): 30 mg via INTRAVENOUS
  Filled 2014-05-11 (×7): qty 1

## 2014-05-11 MED ORDER — FENTANYL CITRATE 0.05 MG/ML IJ SOLN
INTRAMUSCULAR | Status: AC
  Filled 2014-05-11: qty 5

## 2014-05-11 MED ORDER — NEOSTIGMINE METHYLSULFATE 10 MG/10ML IV SOLN
INTRAVENOUS | Status: DC | PRN
  Administered 2014-05-11: 4 mg via INTRAVENOUS

## 2014-05-11 MED ORDER — MIDAZOLAM HCL 5 MG/5ML IJ SOLN
INTRAMUSCULAR | Status: DC | PRN
  Administered 2014-05-11: 2 mg via INTRAVENOUS

## 2014-05-11 MED ORDER — ZOLPIDEM TARTRATE 5 MG PO TABS: 5.0000 mg | ORAL_TABLET | Freq: Every evening | ORAL | Status: DC | PRN

## 2014-05-11 MED ORDER — ACETAMINOPHEN 500 MG PO TABS
1000.0000 mg | ORAL_TABLET | Freq: Four times a day (QID) | ORAL | Status: DC
  Administered 2014-05-11 – 2014-05-12 (×4): 1000 mg via ORAL
  Filled 2014-05-11 (×6): qty 2

## 2014-05-11 MED ORDER — LACTATED RINGERS IV SOLN
INTRAVENOUS | Status: DC
  Administered 2014-05-11: 1000 mL via INTRAVENOUS

## 2014-05-11 MED ORDER — MIDAZOLAM HCL 2 MG/2ML IJ SOLN
INTRAMUSCULAR | Status: AC
  Filled 2014-05-11: qty 2

## 2014-05-11 MED ORDER — 0.9 % SODIUM CHLORIDE (POUR BTL) OPTIME
TOPICAL | Status: DC | PRN
  Administered 2014-05-11: 2000 mL

## 2014-05-11 MED ORDER — HYDROMORPHONE HCL 1 MG/ML IJ SOLN
INTRAMUSCULAR | Status: DC | PRN
  Administered 2014-05-11 (×2): 1 mg via INTRAVENOUS

## 2014-05-11 MED ORDER — SODIUM CHLORIDE 0.9 % IJ SOLN
INTRAMUSCULAR | Status: AC
  Filled 2014-05-11: qty 20

## 2014-05-11 MED ORDER — ONDANSETRON HCL 4 MG/2ML IJ SOLN
4.0000 mg | Freq: Four times a day (QID) | INTRAMUSCULAR | Status: DC | PRN
  Administered 2014-05-11: 4 mg via INTRAVENOUS
  Filled 2014-05-11: qty 2

## 2014-05-11 MED ORDER — CEFAZOLIN SODIUM-DEXTROSE 2-3 GM-% IV SOLR
INTRAVENOUS | Status: AC
  Filled 2014-05-11: qty 50

## 2014-05-11 MED ORDER — DOCUSATE SODIUM 100 MG PO CAPS
100.0000 mg | ORAL_CAPSULE | Freq: Two times a day (BID) | ORAL | Status: DC
  Administered 2014-05-11 – 2014-05-12 (×3): 100 mg via ORAL
  Filled 2014-05-11 (×4): qty 1

## 2014-05-11 MED ORDER — IBUPROFEN 800 MG PO TABS: 800.0000 mg | ORAL_TABLET | Freq: Four times a day (QID) | ORAL | Status: DC | PRN

## 2014-05-11 MED ORDER — HYDROMORPHONE HCL 1 MG/ML IJ SOLN: 0.2500 mg | INTRAMUSCULAR | Status: DC | PRN

## 2014-05-11 MED ORDER — CEFAZOLIN SODIUM-DEXTROSE 2-3 GM-% IV SOLR
2.0000 g | INTRAVENOUS | Status: AC
  Administered 2014-05-11: 2 g via INTRAVENOUS

## 2014-05-11 MED ORDER — DEXAMETHASONE SODIUM PHOSPHATE 10 MG/ML IJ SOLN
INTRAMUSCULAR | Status: DC | PRN
  Administered 2014-05-11: 10 mg via INTRAVENOUS

## 2014-05-11 MED ORDER — DEXAMETHASONE SODIUM PHOSPHATE 10 MG/ML IJ SOLN
INTRAMUSCULAR | Status: AC
  Filled 2014-05-11: qty 1

## 2014-05-11 MED ORDER — ROCURONIUM BROMIDE 100 MG/10ML IV SOLN
INTRAVENOUS | Status: AC
  Filled 2014-05-11: qty 1

## 2014-05-11 MED ORDER — KETOROLAC TROMETHAMINE 30 MG/ML IJ SOLN
30.0000 mg | Freq: Four times a day (QID) | INTRAMUSCULAR | Status: DC
  Filled 2014-05-11 (×4): qty 1

## 2014-05-11 MED ORDER — ONDANSETRON HCL 4 MG/2ML IJ SOLN
INTRAMUSCULAR | Status: DC | PRN
Start: 2014-05-11 — End: 2014-05-11
  Administered 2014-05-11: 4 mg via INTRAVENOUS

## 2014-05-11 MED ORDER — ROCURONIUM BROMIDE 100 MG/10ML IV SOLN
INTRAVENOUS | Status: DC | PRN
  Administered 2014-05-11: 10 mg via INTRAVENOUS
  Administered 2014-05-11: 20 mg via INTRAVENOUS
  Administered 2014-05-11: 40 mg via INTRAVENOUS

## 2014-05-11 MED ORDER — KETOROLAC TROMETHAMINE 30 MG/ML IJ SOLN
INTRAMUSCULAR | Status: AC
  Filled 2014-05-11: qty 1

## 2014-05-11 MED ORDER — TRAMADOL HCL 50 MG PO TABS: 50.0000 mg | ORAL_TABLET | Freq: Four times a day (QID) | ORAL | Status: DC | PRN

## 2014-05-11 MED ORDER — ONDANSETRON HCL 4 MG PO TABS
4.0000 mg | ORAL_TABLET | Freq: Four times a day (QID) | ORAL | Status: DC | PRN
Start: 2014-05-11 — End: 2014-05-12

## 2014-05-11 MED ORDER — PROPOFOL 10 MG/ML IV BOLUS
INTRAVENOUS | Status: DC | PRN
  Administered 2014-05-11: 180 mg via INTRAVENOUS

## 2014-05-11 MED ORDER — GLYCOPYRROLATE 0.2 MG/ML IJ SOLN
INTRAMUSCULAR | Status: AC
  Filled 2014-05-11: qty 3

## 2014-05-11 MED ORDER — OXYCODONE HCL 5 MG PO TABS: 10.0000 mg | ORAL_TABLET | ORAL | Status: DC | PRN

## 2014-05-11 MED ORDER — VASOPRESSIN 20 UNIT/ML IV SOLN
Freq: Once | INTRAVENOUS | Status: AC
  Administered 2014-05-11: 50 mL via INTRAVENOUS
  Filled 2014-05-11: qty 60

## 2014-05-11 MED ORDER — LIP MEDEX EX OINT
TOPICAL_OINTMENT | CUTANEOUS | Status: AC
  Administered 2014-05-11: 19:00:00
  Filled 2014-05-11: qty 7

## 2014-05-11 MED ORDER — MENTHOL 3 MG MT LOZG
1.0000 | LOZENGE | OROMUCOSAL | Status: DC | PRN
  Filled 2014-05-11: qty 9

## 2014-05-11 MED ORDER — PROPOFOL 10 MG/ML IV BOLUS
INTRAVENOUS | Status: AC
  Filled 2014-05-11: qty 20

## 2014-05-11 MED ORDER — FENTANYL CITRATE 0.05 MG/ML IJ SOLN
INTRAMUSCULAR | Status: DC | PRN
  Administered 2014-05-11: 100 ug via INTRAVENOUS
  Administered 2014-05-11 (×2): 50 ug via INTRAVENOUS

## 2014-05-11 MED ORDER — GLYCOPYRROLATE 0.2 MG/ML IJ SOLN
INTRAMUSCULAR | Status: DC | PRN
  Administered 2014-05-11: 0.6 mg via INTRAVENOUS

## 2014-05-11 SURGICAL SUPPLY — 48 items
ATTRACTOMAT 16X20 MAGNETIC DRP (DRAPES) ×2 IMPLANT
BENZOIN TINCTURE PRP APPL 2/3 (GAUZE/BANDAGES/DRESSINGS) ×2 IMPLANT
BLADE EXTENDED COATED 6.5IN (ELECTRODE) ×2 IMPLANT
CHLORAPREP W/TINT 26ML (MISCELLANEOUS) ×2 IMPLANT
CLIP TI MEDIUM LARGE 6 (CLIP) IMPLANT
CONT SPEC 4OZ CLIKSEAL STRL BL (MISCELLANEOUS) IMPLANT
COVER SURGICAL LIGHT HANDLE (MISCELLANEOUS) IMPLANT
DRAPE UTILITY 15X26 (DRAPE) ×2 IMPLANT
DRAPE WARM FLUID 44X44 (DRAPE) ×2 IMPLANT
DRESSING TELFA ISLAND 4X8 (GAUZE/BANDAGES/DRESSINGS) ×2 IMPLANT
DRSG OPSITE POSTOP 4X8 (GAUZE/BANDAGES/DRESSINGS) ×2 IMPLANT
ELECT LIGASURE SHORT 9 REUSE (ELECTRODE) IMPLANT
ELECT REM PT RETURN 9FT ADLT (ELECTROSURGICAL) ×2
ELECTRODE REM PT RTRN 9FT ADLT (ELECTROSURGICAL) ×1 IMPLANT
GAUZE SPONGE 4X4 12PLY STRL (GAUZE/BANDAGES/DRESSINGS) IMPLANT
GAUZE SPONGE 4X4 16PLY XRAY LF (GAUZE/BANDAGES/DRESSINGS) IMPLANT
GLOVE BIO SURGEON STRL SZ 6 (GLOVE) ×4 IMPLANT
GLOVE BIO SURGEON STRL SZ 6.5 (GLOVE) ×4 IMPLANT
GOWN STRL REUS W/ TWL LRG LVL3 (GOWN DISPOSABLE) ×2 IMPLANT
GOWN STRL REUS W/TWL LRG LVL3 (GOWN DISPOSABLE) ×2
KIT BASIN OR (CUSTOM PROCEDURE TRAY) ×2 IMPLANT
LIQUID BAND (GAUZE/BANDAGES/DRESSINGS) IMPLANT
LOOP VESSEL MAXI BLUE (MISCELLANEOUS) IMPLANT
NEEDLE HYPO 22GX1.5 SAFETY (NEEDLE) ×4 IMPLANT
NS IRRIG 1000ML POUR BTL (IV SOLUTION) ×8 IMPLANT
PACK GENERAL/GYN (CUSTOM PROCEDURE TRAY) ×2 IMPLANT
SEPRAFILM MEMBRANE 5X6 (MISCELLANEOUS) ×2 IMPLANT
SHEET LAVH (DRAPES) ×2 IMPLANT
SPONGE LAP 18X18 X RAY DECT (DISPOSABLE) IMPLANT
STAPLER VISISTAT 35W (STAPLE) IMPLANT
SUT MNCRL 0 MO-4 VIOLET 18 CR (SUTURE) ×2 IMPLANT
SUT MNCRL AB 4-0 PS2 18 (SUTURE) ×6 IMPLANT
SUT MON AB 2-0 SH 27 (SUTURE) ×3
SUT MON AB 2-0 SH27 (SUTURE) ×3 IMPLANT
SUT MON AB 3-0 SH 27 (SUTURE) ×2
SUT MON AB 3-0 SH27 (SUTURE) ×2 IMPLANT
SUT MONOCRYL 0 MO 4 18  CR/8 (SUTURE) ×2
SUT PDS AB 1 TP1 96 (SUTURE) ×8 IMPLANT
SUT VIC AB 0 CT1 36 (SUTURE) IMPLANT
SUT VIC AB 2-0 CT1 36 (SUTURE) ×2 IMPLANT
SUT VIC AB 2-0 CT2 27 (SUTURE) IMPLANT
SUT VIC AB 3-0 CTX 36 (SUTURE) IMPLANT
SYR 20CC LL (SYRINGE) ×4 IMPLANT
TAPE STRIPS DRAPE STRL (GAUZE/BANDAGES/DRESSINGS) ×2 IMPLANT
TOWEL OR 17X26 10 PK STRL BLUE (TOWEL DISPOSABLE) ×2 IMPLANT
TOWEL OR NON WOVEN STRL DISP B (DISPOSABLE) ×2 IMPLANT
TRAY FOLEY CATH 14FRSI W/METER (CATHETERS) ×2 IMPLANT
WATER STERILE IRR 1500ML POUR (IV SOLUTION) ×2 IMPLANT

## 2014-05-11 NOTE — Anesthesia Postprocedure Evaluation (Signed)
  Anesthesia Post-op Note  Patient: Donna Orozco  Procedure(s) Performed: Procedure(s): EXPLORATORY LAPAROTOMY/MYOMECTOMY (N/A)  Patient Location: PACU  Anesthesia Type:General  Level of Consciousness: awake and alert   Airway and Oxygen Therapy: Patient Spontanous Breathing  Post-op Pain: none  Post-op Assessment: Post-op Vital signs reviewed, Patient's Cardiovascular Status Stable and Respiratory Function Stable  Post-op Vital Signs: Reviewed  Filed Vitals:   05/11/14 1415  BP: 115/68  Pulse: 94  Temp:   Resp: 17    Complications: No apparent anesthesia complications

## 2014-05-11 NOTE — Anesthesia Procedure Notes (Signed)
Procedure Name: Intubation Performed by: Noralyn Pick D Pre-anesthesia Checklist: Patient identified, Emergency Drugs available, Suction available and Patient being monitored Patient Re-evaluated:Patient Re-evaluated prior to inductionOxygen Delivery Method: Circle System Utilized Preoxygenation: Pre-oxygenation with 100% oxygen Intubation Type: IV induction Ventilation: Mask ventilation without difficulty Laryngoscope Size: Mac and 3 Tube type: Oral Tube size: 7.5 mm Number of attempts: 1 Airway Equipment and Method: Stylet and Oral airway Placement Confirmation: ETT inserted through vocal cords under direct vision,  positive ETCO2 and breath sounds checked- equal and bilateral Secured at: 21 cm Tube secured with: Tape Dental Injury: Teeth and Oropharynx as per pre-operative assessment

## 2014-05-11 NOTE — Op Note (Signed)
Procedure(s): EXPLORATORY LAPAROTOMY/MYOMECTOMY Procedure Note  Donna Orozco female 34 y.o. 05/11/2014  Procedure(s) and Anesthesia Type:    * EXPLORATORY LAPAROTOMY/MYOMECTOMY - General  Surgeon(s) and Role:    * Everitt Amber, MD - Primary    * [name], MD - Assisting   Indications: The patient carries a diagnosis of uterine fibroids.   The patient now presents for open abdominal myomectomies after discussing therapeutic alternatives.        Surgeon: Agnes Lawrence   Assistants: Everitt Amber  Anesthesia: General endotracheal anesthesia      Procedure Detail  EXPLORATORY LAPAROTOMY/MYOMECTOMY  Findings: Large, posterior myoma--8 cm with a submucous component.  Multiple intramural, subserosal myomas varying in size 1 - 3 cm.  Normal tubes, ovaries.  Upon entry into the endometrial cavity, the IUD was visualized and removed.  Estimated Blood Loss:  200 mL          Total IV Fluids: per Anesthesiology          Specimens: Uterine fibroids                Complications:  None         Disposition: PACU - hemodynamically stable.         Condition: stable  Procedure:  The patient was taken to the operating room where she was prepped and draped in semi-lithotomy position.  A time-out was performed confirming the patient, procedure and allergy status.  After the general anesthetic was found to be adequate, a Pfannenstiel incision was made with the scalpel through the previous scar.  This was carried down to the fascia.  The fascia was incised with the Bovie.  The superior aspect of the fascial incision was then grasped with Kocher clamps and the underlying rectus muscle was dissected off with the Bovie.  The inferior aspect of the fascial incision was manipulated in a similar fashion.  The rectus muscles were separated in the midline.  The parieto peritoneum was tented up and entered sharply with Metzenbaum scissors.  The peritoneal incision was extended.  A Book  walter retractor was placed into the incision and the bowel was packed away with moistened laparotomy packs.  The pelvis was inspected with the above findings.  The posterior myoma was identified.  The overlying serosa, myometrium was infiltrated with a dilute Pitressin solution.  A vertical, linear incision was made down through the pseudocapsule with the Bovie.  The edges of the myometrium were grasped with Allis clamps.  The fibroid was grasped with towel clamps.  The fibroid was enucleated using sharp and blunt dissection.  During the dissection, the endometrial cavity was entered and the IUD was removed.  The endometrium was closed with a running suture of 3-0 Monocryl.  Multiple intramural myomas were removed through this incision.  The remained of the incision was closed in layers using a running suture of 0-Monocryl in the myometrium.  The serosal was closed with a 2-0 Monocryl running baseball stitch. Several anterior subserosal myomas 1 - 2 cm in diameter were removed.  The defects/incisions were closed with running baseball stiches using 2-0 Monocryl.  Adequate hemostasis was noted.  The pelvis was irrigated.  Seprafilm was placed in the posterior cul-de-sac.  The retractor and packs were removed from the incision.  The parieto peritoneum was closed in a running fashion with a 2-0 Vicryl suture.  The fascia was closed with 2 running sutures of looped 0-PDS.  The subcutaneous layer was irrigated.  The subcutaneous layer was infiltrated  with a dilute Exparel solution.  The skin was closed in a subcuticular fashion with 3-0 Monocryl.  Steri-strips were applied.  At the close of the procedure, the instrument and pack counts were said to be correct x 2.  The patient was awakened from anesthesia and taken to the recovery room in stable condition.

## 2014-05-11 NOTE — Interval H&P Note (Signed)
History and Physical Interval Note:  05/11/2014 10:22 AM  Donna Orozco  has presented today for surgery, with the diagnosis of LEIOMYOMA  The various methods of treatment have been discussed with the patient and family. After consideration of risks, benefits and other options for treatment, the patient has consented to  Procedure(s): EXPLORATORY LAPAROTOMY/MYOMECTOMY (N/A) OVARIAN CYSTECTOMY (N/A) as a surgical intervention .  The patient's history has been reviewed, patient examined, no change in status, stable for surgery.  I have reviewed the patient's chart and labs.  Questions were answered to the patient's satisfaction.     Donaciano Eva

## 2014-05-11 NOTE — H&P (View-Only) (Signed)
Consult Note: Gyn-Onc  Consult was requested by Dr. Delsa Sale for the evaluation of Donna Orozco 34 y.o. female with symptomatic uterine fibroids.  CC:  Chief Complaint  Patient presents with  . Fibroids    Assessment/Plan:  Ms. Donna Orozco  is a 34 y.o.  year old with symptomatic uterine fibroids refractory to medical management who desires fertility preserving treatment with a myomectomy.  I agree with the surgical plan. I agree with the intended minimally invasive approach with robotic assisted myomectomy. I discussed with the patient that my role during the surgery would be to assist with complex rectoperineal dissection around by all structures. I discussed risks during surgery including damage to major vascular structures causing blood loss and potentially blood transfusion and risk for damage to adjacent structures such as bowel, ureter, bladder. The patient expressed some concern about the robotic approach and expressed interest in potentially having the surgery performed by laparotomy. I discussed with her that I would defer that decision to Dr. Delsa Sale. I explained that laparotomy is associated with increased morbidity for the patient including risk for infection, blood loss, hospitalization, wound complications and delayed return to function. I explained that I would be happy to assist in the surgery in either a minimally invasive or open approach.  The patient is undergoing an MRI to better delineate the location and size of her multiple fibroids. We have surgery scheduled for her on 04/21/2014.   HPI: Donna Orozco is a 34 year old gravida 0 who is seen in consultation at the request of Dr. Delsa Sale for symptomatic uterine fibroids. The patient has a lungs any history of uterine fibroids and is status post port post laparotomy and abdominal myomectomy in the past. She reformed her fibroids postoperatively and has persistent symptoms of heavy menses, intermenstrual  bleeding, and mass effect and pressure symptoms. She has failed medical management of these including oral contraceptive pills and upper just releasing IUD which is in place currently. She was contemplating a definitive hysterectomy however as she has not yet attempted childbearing is now considering repeat myomectomy. She is scheduled to undergo this procedure on 05/11/2014 with Dr. Delsa Sale. My assistance in the surgery has been requested given the presence of cervical myoma and the concern for close proximity to vital structures such as the uterine arteries and ureter.   Current Meds:  Outpatient Encounter Prescriptions as of 05/01/2014  Medication Sig  . Aspirin-Acetaminophen-Caffeine (GOODY HEADACHE PO) Take by mouth as needed.  . Ferrous Sulfate (IRON) 325 (65 FE) MG TABS Take 1 tablet by mouth daily.  Marland Kitchen ibuprofen (ADVIL,MOTRIN) 200 MG tablet Take 200 mg by mouth every 6 (six) hours as needed for pain.  . Multiple Vitamins-Minerals (WOMENS MULTIVITAMIN PLUS PO) Take 1 tablet by mouth daily.  . [DISCONTINUED] desonide (DESOWEN) 0.05 % lotion Apply topically.  . [DISCONTINUED] norethindrone (MICRONOR,CAMILA,ERRIN) 0.35 MG tablet Take 1 tablet by mouth.  . [DISCONTINUED] ACZONE 5 % topical gel Apply 1 application topically 2 (two) times daily.   . [DISCONTINUED] estrogens, conjugated, (PREMARIN) 1.25 MG tablet Take by mouth.  . [DISCONTINUED] fish oil-omega-3 fatty acids 1000 MG capsule Take 1 g by mouth daily.  . [DISCONTINUED] Lactobacillus (DIGESTIVE HEALTH PROBIOTIC) CAPS Take 2 tablets by mouth.  . [DISCONTINUED] Multiple Vitamin (MULTI-VITAMINS) TABS Take by mouth.    Allergy: No Known Allergies  Social Hx:   History   Social History  . Marital Status: Single    Spouse Name: N/A  . Number of Children: 0  .  Years of Education: N/A   Occupational History  . Not on file.   Social History Main Topics  . Smoking status: Never Smoker   . Smokeless tobacco: Never Used  .  Alcohol Use: No  . Drug Use: No  . Sexual Activity:    Partners: Male    Birth Control/ Protection: IUD   Other Topics Concern  . Not on file   Social History Narrative    Past Surgical Hx:  Past Surgical History  Procedure Laterality Date  . Myomectomy  03/2009  . Hysteroscopy w/d&c N/A 11/12/2012    Procedure: DILATATION AND CURETTAGE /HYSTEROSCOPY;  Surgeon: [name], MD;  Location: [facility];  Service: Gynecology;  Laterality: N/A;  with insertion of mirena IUD    Past Medical Hx:  Past Medical History  Diagnosis Date  . Fibroids   . Anemia     Past Gynecological History:  G0, prior myomectomy (laparotomy)  No LMP recorded.  Family Hx:  Family History  Problem Relation Age of Onset  . Hypertension Mother   . Diabetes Sister   . Sarcoidosis Brother     ? testing stages  . Heart disease Maternal Grandmother   . Dementia Maternal Grandmother     Review of Systems:  Constitutional  Feels well,    ENT Normal appearing ears and nares bilaterally Skin/Breast  No rash, sores, jaundice, itching, dryness Cardiovascular  No chest pain, shortness of breath, or edema  Pulmonary  No cough or wheeze.  Gastro Intestinal  No nausea, vomitting, or diarrhoea. No bright red blood per rectum, no abdominal pain, change in bowel movement, or constipation.  Genito Urinary  No frequency, urgency, dysuria, see HPi Musculo Skeletal  No myalgia, arthralgia, joint swelling or pain  Neurologic  No weakness, numbness, change in gait,  Psychology  No depression, anxiety, insomnia.   Vitals:  Blood pressure 133/80, pulse 76, temperature 98.4 F (36.9 C), temperature source Oral, resp. rate 20, height 5\' 9"  (1.753 m), weight 144 lb (65.318 kg).  Physical Exam: WD in NAD Neck  Supple NROM, without any enlargements.  Lymph Node Survey No cervical supraclavicular or inguinal adenopathy Cardiovascular  Pulse normal rate, regularity and rhythm. S1 and S2 normal.  Lungs   Clear to auscultation bilateraly, without wheezes/crackles/rhonchi. Good air movement.  Skin  No rash/lesions/breakdown  Psychiatry  Alert and oriented to person, place, and time  Abdomen  Normoactive bowel sounds, abdomen soft, non-tender and very thin without evidence of hernia. Back No CVA tenderness Genito Urinary  Vulva/vagina: Normal external female genitalia.  No lesions. No discharge or bleeding.  Bladder/urethra:  No lesions or masses, well supported bladder  Vagina: normal  Cervix: Distorted to the right. Normal appearing, no lesions.  Uterus: bulky, 12 week-14 week size. Cervical/lower uterine segment fibroid palpable and smooth on the left.  Adnexa: no discrete palpable ovarian masses. Rectal  Good tone, no masses no cul de sac nodularity.  Extremities  No bilateral cyanosis, clubbing or edema.  Donaciano Eva, MD   05/01/2014, 4:27 PM

## 2014-05-11 NOTE — Anesthesia Preprocedure Evaluation (Signed)
Anesthesia Evaluation  Patient identified by MRN, date of birth, ID band Patient awake    Reviewed: Allergy & Precautions, H&P , NPO status , Patient's Chart, lab work & pertinent test results  Airway Mallampati: I  TM Distance: >3 FB Neck ROM: Full    Dental no notable dental hx. (+) Teeth Intact, Dental Advisory Given   Pulmonary neg pulmonary ROS,  breath sounds clear to auscultation  Pulmonary exam normal       Cardiovascular negative cardio ROS  Rhythm:Regular Rate:Normal     Neuro/Psych negative neurological ROS  negative psych ROS   GI/Hepatic negative GI ROS, Neg liver ROS,   Endo/Other  negative endocrine ROS  Renal/GU negative Renal ROS  negative genitourinary   Musculoskeletal   Abdominal   Peds  Hematology  (+) anemia ,   Anesthesia Other Findings   Reproductive/Obstetrics negative OB ROS                             Anesthesia Physical Anesthesia Plan  ASA: II  Anesthesia Plan: General   Post-op Pain Management:    Induction: Intravenous  Airway Management Planned: Oral ETT  Additional Equipment:   Intra-op Plan:   Post-operative Plan: Extubation in OR  Informed Consent: I have reviewed the patients History and Physical, chart, labs and discussed the procedure including the risks, benefits and alternatives for the proposed anesthesia with the patient or authorized representative who has indicated his/her understanding and acceptance.   Dental advisory given  Plan Discussed with: CRNA  Anesthesia Plan Comments:         Anesthesia Quick Evaluation

## 2014-05-11 NOTE — Transfer of Care (Signed)
Immediate Anesthesia Transfer of Care Note  Patient: Donna Orozco  Procedure(s) Performed: Procedure(s): EXPLORATORY LAPAROTOMY/MYOMECTOMY (N/A)  Patient Location: PACU  Anesthesia Type:General  Level of Consciousness: awake, alert  and oriented  Airway & Oxygen Therapy: Patient Spontanous Breathing and Patient connected to face mask oxygen  Post-op Assessment: Report given to RN and Post -op Vital signs reviewed and stable  Post vital signs: Reviewed and stable  Last Vitals:  Filed Vitals:   05/11/14 0804  BP: 128/79  Pulse: 76  Temp: 36.6 C  Resp: 18    Complications: No apparent anesthesia complications

## 2014-05-12 ENCOUNTER — Encounter (HOSPITAL_COMMUNITY): Payer: Self-pay | Admitting: Gynecologic Oncology

## 2014-05-12 DIAGNOSIS — D251 Intramural leiomyoma of uterus: Secondary | ICD-10-CM | POA: Diagnosis not present

## 2014-05-12 LAB — CBC
HCT: 29.7 % — ABNORMAL LOW (ref 36.0–46.0)
Hemoglobin: 10 g/dL — ABNORMAL LOW (ref 12.0–15.0)
MCH: 28 pg (ref 26.0–34.0)
MCHC: 33.7 g/dL (ref 30.0–36.0)
MCV: 83.2 fL (ref 78.0–100.0)
PLATELETS: 203 10*3/uL (ref 150–400)
RBC: 3.57 MIL/uL — ABNORMAL LOW (ref 3.87–5.11)
RDW: 13 % (ref 11.5–15.5)
WBC: 10.2 10*3/uL (ref 4.0–10.5)

## 2014-05-12 MED ORDER — DOCUSATE SODIUM 100 MG PO CAPS: 100.0000 mg | ORAL_CAPSULE | Freq: Two times a day (BID) | ORAL | Status: DC

## 2014-05-12 MED ORDER — ACETAMINOPHEN 500 MG PO TABS: 1000.0000 mg | ORAL_TABLET | Freq: Four times a day (QID) | ORAL | Status: AC

## 2014-05-12 MED ORDER — ONDANSETRON HCL 4 MG PO TABS: 4.0000 mg | ORAL_TABLET | Freq: Four times a day (QID) | ORAL | Status: DC | PRN

## 2014-05-12 MED ORDER — IBUPROFEN 800 MG PO TABS: 800.0000 mg | ORAL_TABLET | Freq: Four times a day (QID) | ORAL | Status: DC | PRN

## 2014-05-12 MED ORDER — TRAMADOL HCL 50 MG PO TABS: 50.0000 mg | ORAL_TABLET | Freq: Four times a day (QID) | ORAL | Status: DC | PRN

## 2014-05-12 NOTE — Progress Notes (Signed)
UR completed 

## 2014-05-12 NOTE — Discharge Summary (Signed)
Physician Discharge Summary  Patient ID: Donna Orozco MRN: [id number] DOB/AGE: [birth year] 34 y.o.  Admit date: 05/11/2014 Discharge date: 05/12/2014  Admission Diagnoses: <principal problem not specified>  Discharge Diagnoses:  Active Problems:   Leiomyoma of uterus   Fibroids   Discharged Condition: good  Hospital Course: patient was admitted for surgery on 05/11/14 and received an abdominal myomectomy. Surgery was uncomplicated. Pain was well controlled postop. She had postoperative anemia and was meeting discharge criteria on POD 1. She has postop anemia.  Consults: None  Significant Diagnostic Studies: labs: Hb 10.0mg /dL  Treatments: surgery: exploratory laparotomy, abdominal myomectomy  Discharge Exam: Blood pressure 122/76, pulse 71, temperature 97.7 F (36.5 C), temperature source Oral, resp. rate 14, height 5\' 9"  (1.753 m), weight 144 lb (65.318 kg), last menstrual period 05/04/2014, SpO2 100 %. General appearance: alert and cooperative Resp: clear to auscultation bilaterally and normal percussion bilaterally Cardio: regular rate and rhythm, S1, S2 normal, no murmur, click, rub or gallop GI: soft, non-tender; bowel sounds normal; no masses,  no organomegaly and incision clean dry and intact  Disposition: 01-Home or Self Care     Medication List    ASK your doctor about these medications        GOODY HEADACHE PO  Take 1 packet by mouth every 6 (six) hours as needed (for pain).     ibuprofen 200 MG tablet  Commonly known as:  ADVIL,MOTRIN  Take 200 mg by mouth every 6 (six) hours as needed for pain.     Iron 325 (65 FE) MG Tabs  Take 1 tablet by mouth daily.     WOMENS MULTIVITAMIN PLUS PO  Take 1 tablet by mouth daily.           Follow-up Information    Follow up with Dr Delsa Sale. Schedule an appointment as soon as possible for a visit in 4 weeks.   Contact information:   [place] office, [facility]      Signed: Donaciano Eva 05/12/2014, 4:52 PM

## 2014-05-12 NOTE — Progress Notes (Signed)
1 Day Post-Op Procedure(s) (LRB): EXPLORATORY LAPAROTOMY/MYOMECTOMY (N/A)  Subjective: Patient reports minimal pain, just gas pains  no problems voiding.    Objective: Vital signs in last 24 hours: Temp:  [97.6 F (36.4 C)-98.8 F (37.1 C)] 98.2 F (36.8 C) (03/25 0510) Pulse Rate:  [83-94] 88 (03/25 0510) Resp:  [14-17] 14 (03/25 0510) BP: (100-129)/(63-78) 100/63 mmHg (03/25 0510) SpO2:  [93 %-100 %] 100 % (03/25 0510) Last BM Date: 05/09/14  Intake/Output from previous day: 03/24 0701 - 03/25 0700 In: 2388.8 [P.O.:240; I.V.:2148.8] Out: 3800 [Urine:3600; Blood:200]  Physical Examination: General: alert and cooperative Resp: clear to auscultation bilaterally Cardio: regular rate and rhythm, S1, S2 normal, no murmur, click, rub or gallop GI: soft, non-tender; bowel sounds normal; no masses,  no organomegaly and incision: clean, dry and intact Extremities: extremities normal, atraumatic, no cyanosis or edema Vaginal Bleeding: minimal  Labs: WBC/Hgb/Hct/Plts:  10.2/10.0/29.7/203 (03/25 0433)     Assessment:  34 y.o. s/p Procedure(s): EXPLORATORY LAPAROTOMY/MYOMECTOMY: stable Pain:  Pain is well-controlled on oral medications.  Heme:Anemia: appropriate for postop blood loss  ID: no issues  CV: hemodynamically stable.  GI:  Tolerating po: Yes   . FEN: hep lock IVF.  Endo: no issues.  Prophylaxis: intermittent pneumatic compression boots.  Plan: Advance diet Encourage ambulation Discharge home Fe SO4 supplementation  Dispo:  Discharge plan to include :home if patient tolerating PO well later today and has improved gas pains.      Donaciano Eva 05/12/2014, 12:13 PM

## 2014-05-12 NOTE — Discharge Instructions (Signed)
05/12/2014  Return to work: 4-6 weeks  Activity: 1. Be up and out of the bed during the day.  Take a nap if needed.  You may walk up steps but be careful and use the hand rail.  Stair climbing will tire you more than you think, you may need to stop part way and rest.   2. No lifting or straining for 6 weeks.  3. No driving for 1-2 weeks.  Do Not drive if you are taking narcotic pain medicine.  4. Shower daily.  Use soap and water on your incision and pat dry; don't rub.   5. No sexual activity and nothing in the vagina for 4 weeks.  Diet: 1. Low sodium Heart Healthy Diet is recommended.  2. It is safe to use a laxative if you have difficulty moving your bowels.   Wound Care: 1. Keep clean and dry.  Shower daily.  Reasons to call the Doctor:   Fever - Oral temperature greater than 100.4 degrees Fahrenheit  Foul-smelling vaginal discharge  Difficulty urinating  Nausea and vomiting  Increased pain at the site of the incision that is unrelieved with pain medicine.  Difficulty breathing with or without chest pain  New calf pain especially if only on one side  Sudden, continuing increased vaginal bleeding with or without clots.   Follow-up: 1. See Dr. Delsa Sale in 3-4 weeks.  Contacts: For questions or concerns you should contact:  Dr. [name] at [phone]

## 2014-05-12 NOTE — Progress Notes (Signed)
Nurse reviewed discharge instructions with pt.  Pt verbalized understanding of discharge instructions, follow up appointment and new medications.  No concerns at time of discharge.  Prescription given at time of discharge.

## 2017-05-28 NOTE — H&P (Signed)
Problem List Items Addressed This Visit          Genitourinary   Intramural leiomyoma of uterus - Primary          Other Visit Diagnoses    Abnormal uterine bleeding        PALM-COEIN: AUB-L, HMB There is a history of multiple abdominal myomectomies x 2.  She has secondary AUB and bulk related symptoms.  She is requesting definitive surgery.   Plan The patient elects to proceed with a TAH-BS.  Informed consent was obtained for these procedures.  The risks of the procedure were reviewed including but not limited to VTE, injury to the abdominal/pelvic viscera, infection, hemorrhage.  An alternative MIS approach with the attendant benefits was offered and declined.  She was also offered an REI referral in light of her nulliparity. Uterine-sparing options with continued medical management of her symptoms was discussed..  Questions were answered to the patient's stated satisfaction.  Education materials were provided.  She was counseled to discontinue ibuprofen 72 hrs prior to the procedure.  Continue the high-dose oral progestin and iron supplements until the procedure.    Follow up as needed or postoperatively       Patient ID: Donna Orozco, female   DOB: Jan 29, 1981, 37 y.o.   MRN: [id number]      Chief Complaint  Patient presents with  . Follow-up    Surgical Consult    HPI Donna Orozco is a 37 y.o. female.  She presents for a preoperative visit.  She has no new complaints.  She carries a diagnosis of uterine fibroids. There is a h/o multiple, open myomectomies x 2.  She has secondary bulk-related symptoms, HMB.  She has also had a secondary iron-deficiency anemia.  The symptoms have been refractory to medical management including TXA, high dose oral progestin.  Her symptoms have progressively worsened since last 8/18. She endorses heavy flow with her menses; at times soiling her clothing.  The pelvic U/S from  10/18 revealed multiple  subserosal/intramural myomas; the sagittal diameter was 10.9 cm. A hemoglobin in 10/18 was 12.5.  A pap smear in 2/18 was ASCUS/HPV neg.         HPI  PastMedicalHistory      Past Medical History:  Diagnosis Date  . Abdominal pain       PastSurgicalHistory       Past Surgical History:  Procedure Laterality Date  . [place]  2014  . MYOMECTOMY  2011      FamilyHistory       Family History  Problem Relation Age of Onset  . Diabetes Sister   . Diabetes Brother   . Sarcoidosis Brother   . Breast cancer Neg Hx   . Colon cancer Neg Hx   . Endometrial cancer Neg Hx   . Ovarian cancer Neg Hx       Social History     Social History  Substance Use Topics  . Smoking status: Never Smoker  . Smokeless tobacco: Never Used  . Alcohol use No    No Known Allergies  CurrentMedications        Current Outpatient Prescriptions  Medication Sig Dispense Refill  . aspirin-acetaminophen-caffeine (EXCEDRIN MIGRAINE) 250-250-65 mg per tablet Take 1 packet by mouth.    . diclofenac epolamine (FLECTOR) 1.3 % PT12 Apply 1 patch topically Two (2) times a day. 30 patch 2  . ferrous sulfate 325 (65 FE) MG tablet Take by mouth daily.    Marland Kitchen ibuprofen (ADVIL,MOTRIN) 600  MG tablet Take 600 mg by mouth Three (3) times a day.    . multivitamin (THERAGRAN) per tablet Take 1 tablet by mouth daily.    . norethindrone (AYGESTIN) 5 mg tablet Take 1 tablet (5 mg total) by mouth Two (2) times a day. 90 tablet 3   No current facility-administered medications for this visit.       Review of Systems Review of Systems Constitutional: negative for fatigue and weight loss Respiratory: negative for cough and wheezing Cardiovascular: negative for chest pain, fatigue and palpitations Gastrointestinal: positive for abdominal pain and negative for a change in bowel habits Genitourinary: positive for heavy vaginal bleeding Integument/breast: negative for nipple  discharge Musculoskeletal:negative for myalgias Neurological: negative for gait problems and tremors Behavioral/Psych: negative for abusive relationship, depression Endocrine: negative for temperature intolerance      Blood pressure 139/77, pulse 84, resp. rate 17, height 176.5 cm (5' 9.5"), weight 71.5 kg (157 lb 9.6 oz), not currently breastfeeding.  Physical Exam  General:   alert  Skin:   no rash or abnormalities  Lungs:   clear to auscultation bilaterally  Heart:   regular rate and rhythm, S1, S2 normal, no murmur, click, rub or gallop  Abdomen:  Mass arising from the pelvis to the right of the midline, approximately 3 fingerbreadths above the pubic symphysis, firm, tender  Pelvis:  External genitalia: normal general appearance Urinary system: urethral meatus normal and bladder without fullness, nontender Vaginal: normal without tenderness, induration or masses; moderate heme present Cervix: normal appearance Adnexa: Nonpalpable Uterus: anteverted, irregular contour, approximately 14-16 weeks size in aggregate; ?left-sided lower uterine segment myoma; fibroids fill  the pelvis deviating the cervix posteriorly          Data Reviewed Imaging, labs

## 2017-06-11 ENCOUNTER — Encounter (HOSPITAL_COMMUNITY): Payer: Self-pay

## 2017-06-11 NOTE — Patient Instructions (Addendum)
Adelynn Gipe  06/11/2017   Your procedure is scheduled on: 06-18-17  Report to [facility] Main  Entrance  Report to admitting at     0530 AM    Call this number if you have problems the morning of surgery [phone]               FOLLOW A LIGHT DIET THE DAY BEFORE SURGERY: EXAMPLES ARE TOAST, SOUPS, BROTHS, YOGURT, AND MASHED POTATOES. AVOID: RAW  FRUITS AND VEGGIES AND CARBONATED BEVERAGES and             beans   Remember: Do not eat food or drink liquids :After Midnight.     Take these medicines the morning of surgery with A SIP OF WATER: none                                You may not have any metal on your body including hair pins and              piercings  Do not wear jewelry, make-up, lotions, powders or perfumes, deodorant             Do not wear nail polish.  Do not shave  48 hours prior to surgery.     Do not bring valuables to the hospital. [place].  Contacts, dentures or bridgework may not be worn into surgery.  Leave suitcase in the car. After surgery it may be brought to your room.                Please read over the following fact sheets you were given: _____________________________________________________________________           [facility] - Preparing for Surgery Before surgery, you can play an important role.  Because skin is not sterile, your skin needs to be as free of germs as possible.  You can reduce the number of germs on your skin by washing with CHG (chlorahexidine gluconate) soap before surgery.  CHG is an antiseptic cleaner which kills germs and bonds with the skin to continue killing germs even after washing. Please DO NOT use if you have an allergy to CHG or antibacterial soaps.  If your skin becomes reddened/irritated stop using the CHG and inform your nurse when you arrive at Short Stay. Do not shave (including legs and underarms) for at least 48 hours prior to the first  CHG shower.  You may shave your face/neck. Please follow these instructions carefully:  1.  Shower with CHG Soap the night before surgery and the  morning of Surgery.  2.  If you choose to wash your hair, wash your hair first as usual with your  normal  shampoo.  3.  After you shampoo, rinse your hair and body thoroughly to remove the  shampoo.                           4.  Use CHG as you would any other liquid soap.  You can apply chg directly  to the skin and wash                       Gently with a  scrungie or clean washcloth.  5.  Apply the CHG Soap to your body ONLY FROM THE NECK DOWN.   Do not use on face/ open                           Wound or open sores. Avoid contact with eyes, ears mouth and genitals (private parts).                       Wash face,  Genitals (private parts) with your normal soap.             6.  Wash thoroughly, paying special attention to the area where your surgery  will be performed.  7.  Thoroughly rinse your body with warm water from the neck down.  8.  DO NOT shower/wash with your normal soap after using and rinsing off  the CHG Soap.                9.  Pat yourself dry with a clean towel.            10.  Wear clean pajamas.            11.  Place clean sheets on your bed the night of your first shower and do not  sleep with pets. Day of Surgery : Do not apply any lotions/deodorants the morning of surgery.  Please wear clean clothes to the hospital/surgery center.  FAILURE TO FOLLOW THESE INSTRUCTIONS MAY RESULT IN THE CANCELLATION OF YOUR SURGERY PATIENT SIGNATURE_________________________________  NURSE SIGNATURE__________________________________  ________________________________________________________________________ WHAT IS A BLOOD TRANSFUSION? Blood Transfusion Information  A transfusion is the replacement of blood or some of its parts. Blood is made up of multiple cells which provide different functions.  Red blood cells carry oxygen and are used  for blood loss replacement.  White blood cells fight against infection.  Platelets control bleeding.  Plasma helps clot blood.  Other blood products are available for specialized needs, such as hemophilia or other clotting disorders. BEFORE THE TRANSFUSION  Who gives blood for transfusions?   Healthy volunteers who are fully evaluated to make sure their blood is safe. This is blood bank blood. Transfusion therapy is the safest it has ever been in the practice of medicine. Before blood is taken from a donor, a complete history is taken to make sure that person has no history of diseases nor engages in risky social behavior (examples are intravenous drug use or sexual activity with multiple partners). The donor's travel history is screened to minimize risk of transmitting infections, such as malaria. The donated blood is tested for signs of infectious diseases, such as HIV and hepatitis. The blood is then tested to be sure it is compatible with you in order to minimize the chance of a transfusion reaction. If you or a relative donates blood, this is often done in anticipation of surgery and is not appropriate for emergency situations. It takes many days to process the donated blood. RISKS AND COMPLICATIONS Although transfusion therapy is very safe and saves many lives, the main dangers of transfusion include:   Getting an infectious disease.  Developing a transfusion reaction. This is an allergic reaction to something in the blood you were given. Every precaution is taken to prevent this. The decision to have a blood transfusion has been considered carefully by your caregiver before blood is given. Blood is not given unless the benefits outweigh the risks. AFTER THE  TRANSFUSION  Right after receiving a blood transfusion, you will usually feel much better and more energetic. This is especially true if your red blood cells have gotten low (anemic). The transfusion raises the level of the red blood  cells which carry oxygen, and this usually causes an energy increase.  The nurse administering the transfusion will monitor you carefully for complications. HOME CARE INSTRUCTIONS  No special instructions are needed after a transfusion. You may find your energy is better. Speak with your caregiver about any limitations on activity for underlying diseases you may have. SEEK MEDICAL CARE IF:   Your condition is not improving after your transfusion.  You develop redness or irritation at the intravenous (IV) site. SEEK IMMEDIATE MEDICAL CARE IF:  Any of the following symptoms occur over the next 12 hours:  Shaking chills.  You have a temperature by mouth above 102 F (38.9 C), not controlled by medicine.  Chest, back, or muscle pain.  People around you feel you are not acting correctly or are confused.  Shortness of breath or difficulty breathing.  Dizziness and fainting.  You get a rash or develop hives.  You have a decrease in urine output.  Your urine turns a dark color or changes to pink, red, or brown. Any of the following symptoms occur over the next 10 days:  You have a temperature by mouth above 102 F (38.9 C), not controlled by medicine.  Shortness of breath.  Weakness after normal activity.  The white part of the eye turns yellow (jaundice).  You have a decrease in the amount of urine or are urinating less often.  Your urine turns a dark color or changes to pink, red, or brown. Document Released: 02/01/2000 Document Revised: 04/28/2011 Document Reviewed: 09/20/2007 ExitCare Patient Information 2014 [place].  _______________________________________________________________________      Incentive Spirometer  An incentive spirometer is a tool that can help keep your lungs clear and active. This tool measures how well you are filling your lungs with each breath. Taking long deep breaths may help reverse or decrease the chance of developing breathing  (pulmonary) problems (especially infection) following:  A long period of time when you are unable to move or be active. BEFORE THE PROCEDURE   If the spirometer includes an indicator to show your best effort, your nurse or respiratory therapist will set it to a desired goal.  If possible, sit up straight or lean slightly forward. Try not to slouch.  Hold the incentive spirometer in an upright position. INSTRUCTIONS FOR USE  1. Sit on the edge of your bed if possible, or sit up as far as you can in bed or on a chair. 2. Hold the incentive spirometer in an upright position. 3. Breathe out normally. 4. Place the mouthpiece in your mouth and seal your lips tightly around it. 5. Breathe in slowly and as deeply as possible, raising the piston or the ball toward the top of the column. 6. Hold your breath for 3-5 seconds or for as long as possible. Allow the piston or ball to fall to the bottom of the column. 7. Remove the mouthpiece from your mouth and breathe out normally. 8. Rest for a few seconds and repeat Steps 1 through 7 at least 10 times every 1-2 hours when you are awake. Take your time and take a few normal breaths between deep breaths. 9. The spirometer may include an indicator to show your best effort. Use the indicator as a goal to work  toward during each repetition. 10. After each set of 10 deep breaths, practice coughing to be sure your lungs are clear. If you have an incision (the cut made at the time of surgery), support your incision when coughing by placing a pillow or rolled up towels firmly against it. Once you are able to get out of bed, walk around indoors and cough well. You may stop using the incentive spirometer when instructed by your caregiver.  RISKS AND COMPLICATIONS  Take your time so you do not get dizzy or light-headed.  If you are in pain, you may need to take or ask for pain medication before doing incentive spirometry. It is harder to take a deep breath if you  are having pain. AFTER USE  Rest and breathe slowly and easily.  It can be helpful to keep track of a log of your progress. Your caregiver can provide you with a simple table to help with this. If you are using the spirometer at home, follow these instructions: [place] IF:   You are having difficultly using the spirometer.  You have trouble using the spirometer as often as instructed.  Your pain medication is not giving enough relief while using the spirometer.  You develop fever of 100.5 F (38.1 C) or higher. SEEK IMMEDIATE MEDICAL CARE IF:   You cough up bloody sputum that had not been present before.  You develop fever of 102 F (38.9 C) or greater.  You develop worsening pain at or near the incision site. MAKE SURE YOU:   Understand these instructions.  Will watch your condition.  Will get help right away if you are not doing well or get worse. Document Released: 06/16/2006 Document Revised: 04/28/2011 Document Reviewed: 08/17/2006 [facility] Patient Information 2014 [place], [place].   ________________________________________________________________________

## 2017-06-12 ENCOUNTER — Encounter (HOSPITAL_COMMUNITY): Payer: Self-pay

## 2017-06-12 ENCOUNTER — Encounter (HOSPITAL_COMMUNITY)
Admission: RE | Admit: 2017-06-12 | Discharge: 2017-06-12 | Disposition: A | Discharge status: Home / Self Care | Payer: BLUE CROSS/BLUE SHIELD | Source: Ambulatory Visit | Attending: Obstetrics & Gynecology | Admitting: Obstetrics & Gynecology

## 2017-06-12 ENCOUNTER — Other Ambulatory Visit: Payer: Self-pay

## 2017-06-12 DIAGNOSIS — Z0183 Encounter for blood typing: Secondary | ICD-10-CM | POA: Insufficient documentation

## 2017-06-12 DIAGNOSIS — D259 Leiomyoma of uterus, unspecified: Secondary | ICD-10-CM | POA: Diagnosis not present

## 2017-06-12 DIAGNOSIS — Z01812 Encounter for preprocedural laboratory examination: Secondary | ICD-10-CM | POA: Insufficient documentation

## 2017-06-12 HISTORY — DX: Pneumonia, unspecified organism: J18.9

## 2017-06-12 LAB — CBC
HCT: 32.7 % — ABNORMAL LOW (ref 36.0–46.0)
HEMOGLOBIN: 9.7 g/dL — AB (ref 12.0–15.0)
MCH: 21.3 pg — ABNORMAL LOW (ref 26.0–34.0)
MCHC: 29.7 g/dL — ABNORMAL LOW (ref 30.0–36.0)
MCV: 71.7 fL — ABNORMAL LOW (ref 78.0–100.0)
Platelets: 369 10*3/uL (ref 150–400)
RBC: 4.56 MIL/uL (ref 3.87–5.11)
RDW: 17.7 % — ABNORMAL HIGH (ref 11.5–15.5)
WBC: 7 10*3/uL (ref 4.0–10.5)

## 2017-06-12 LAB — BASIC METABOLIC PANEL
Anion gap: 10 (ref 5–15)
BUN: 6 mg/dL (ref 6–20)
CALCIUM: 8.8 mg/dL — AB (ref 8.9–10.3)
CO2: 24 mmol/L (ref 22–32)
Chloride: 107 mmol/L (ref 101–111)
Creatinine, Ser: 0.81 mg/dL (ref 0.44–1.00)
GFR calc Af Amer: >60 mL/min (ref >60)
Glucose, Bld: 94 mg/dL (ref 65–99)
Potassium: 3.6 mmol/L (ref 3.5–5.1)
SODIUM: 141 mmol/L (ref 135–145)

## 2017-06-12 LAB — PREGNANCY, URINE: PREG TEST UR: NEGATIVE

## 2017-06-12 NOTE — Progress Notes (Signed)
Cbc done 06-12-17 routed to Dr. Delsa Sale via epic

## 2017-06-17 NOTE — Anesthesia Preprocedure Evaluation (Addendum)
Anesthesia Evaluation  Patient identified by MRN, date of birth, ID band Patient awake    Reviewed: Allergy & Precautions, NPO status , Patient's Chart, lab work & pertinent test results  Airway Mallampati: II  TM Distance: >3 FB Neck ROM: Full    Dental  (+) Dental Advisory Given   Pulmonary neg pulmonary ROS,    breath sounds clear to auscultation       Cardiovascular negative cardio ROS   Rhythm:Regular Rate:Normal     Neuro/Psych negative neurological ROS     GI/Hepatic negative GI ROS, Neg liver ROS,   Endo/Other  negative endocrine ROS  Renal/GU negative Renal ROS     Musculoskeletal   Abdominal   Peds  Hematology  (+) anemia ,   Anesthesia Other Findings   Reproductive/Obstetrics                            Lab Results  Component Value Date   WBC 7.0 06/12/2017   HGB 9.7 (L) 06/12/2017   HCT 32.7 (L) 06/12/2017   MCV 71.7 (L) 06/12/2017   PLT 369 06/12/2017   Lab Results  Component Value Date   CREATININE 0.81 06/12/2017   BUN 6 06/12/2017   NA 141 06/12/2017   K 3.6 06/12/2017   CL 107 06/12/2017   CO2 24 06/12/2017    Anesthesia Physical Anesthesia Plan  ASA: II  Anesthesia Plan: General   Post-op Pain Management:    Induction: Intravenous  PONV Risk Score and Plan: 4 or greater and Scopolamine patch - Pre-op, Midazolam, Dexamethasone, Ondansetron and Treatment may vary due to age or medical condition  Airway Management Planned: Oral ETT  Additional Equipment:   Intra-op Plan:   Post-operative Plan: Extubation in OR  Informed Consent: I have reviewed the patients History and Physical, chart, labs and discussed the procedure including the risks, benefits and alternatives for the proposed anesthesia with the patient or authorized representative who has indicated his/her understanding and acceptance.   Dental advisory given  Plan Discussed with:    Anesthesia Plan Comments:        Anesthesia Quick Evaluation

## 2017-06-18 ENCOUNTER — Inpatient Hospital Stay (HOSPITAL_COMMUNITY): Payer: BLUE CROSS/BLUE SHIELD | Admitting: Anesthesiology

## 2017-06-18 ENCOUNTER — Inpatient Hospital Stay (HOSPITAL_COMMUNITY)
Admission: RE | Admit: 2017-06-18 | Discharge: 2017-06-20 | DRG: 742 | Disposition: A | Discharge status: Home / Self Care | Payer: BLUE CROSS/BLUE SHIELD | Attending: Obstetrics & Gynecology | Admitting: Obstetrics & Gynecology

## 2017-06-18 ENCOUNTER — Encounter (HOSPITAL_COMMUNITY): Payer: Self-pay | Admitting: Emergency Medicine

## 2017-06-18 ENCOUNTER — Encounter (HOSPITAL_COMMUNITY)
Admission: RE | Disposition: A | Discharge status: Home / Self Care | Payer: Self-pay | Source: Home / Self Care | Attending: Obstetrics & Gynecology

## 2017-06-18 ENCOUNTER — Other Ambulatory Visit: Payer: Self-pay

## 2017-06-18 DIAGNOSIS — D259 Leiomyoma of uterus, unspecified: Secondary | ICD-10-CM

## 2017-06-18 DIAGNOSIS — K660 Peritoneal adhesions (postprocedural) (postinfection): Secondary | ICD-10-CM | POA: Diagnosis present

## 2017-06-18 DIAGNOSIS — Z833 Family history of diabetes mellitus: Secondary | ICD-10-CM | POA: Diagnosis not present

## 2017-06-18 DIAGNOSIS — D509 Iron deficiency anemia, unspecified: Secondary | ICD-10-CM | POA: Diagnosis present

## 2017-06-18 DIAGNOSIS — D62 Acute posthemorrhagic anemia: Secondary | ICD-10-CM | POA: Diagnosis not present

## 2017-06-18 DIAGNOSIS — D251 Intramural leiomyoma of uterus: Secondary | ICD-10-CM | POA: Diagnosis present

## 2017-06-18 HISTORY — PX: BILATERAL SALPINGECTOMY: SHX5743

## 2017-06-18 HISTORY — PX: LAPAROTOMY: SHX154

## 2017-06-18 HISTORY — PX: ABDOMINAL HYSTERECTOMY: SHX81

## 2017-06-18 LAB — TYPE AND SCREEN
ABO/RH(D): A NEG
Antibody Screen: NEGATIVE

## 2017-06-18 SURGERY — LAPAROTOMY, EXPLORATORY
Anesthesia: General

## 2017-06-18 MED ORDER — BUPIVACAINE HCL (PF) 0.25 % IJ SOLN
INTRAMUSCULAR | Status: AC
  Filled 2017-06-18: qty 30

## 2017-06-18 MED ORDER — CEFAZOLIN SODIUM-DEXTROSE 2-4 GM/100ML-% IV SOLN
2.0000 g | INTRAVENOUS | Status: AC
  Administered 2017-06-18: 2 g via INTRAVENOUS
  Filled 2017-06-18: qty 100

## 2017-06-18 MED ORDER — SUGAMMADEX SODIUM 200 MG/2ML IV SOLN
INTRAVENOUS | Status: DC | PRN
  Administered 2017-06-18: 150 mg via INTRAVENOUS

## 2017-06-18 MED ORDER — ACETAMINOPHEN 500 MG PO TABS
1000.0000 mg | ORAL_TABLET | Freq: Four times a day (QID) | ORAL | Status: DC
  Administered 2017-06-18 – 2017-06-20 (×7): 1000 mg via ORAL
  Filled 2017-06-18 (×8): qty 2

## 2017-06-18 MED ORDER — ONDANSETRON HCL 4 MG/2ML IJ SOLN: 4.0000 mg | Freq: Four times a day (QID) | INTRAMUSCULAR | Status: DC | PRN

## 2017-06-18 MED ORDER — PROMETHAZINE HCL 25 MG/ML IJ SOLN: 6.2500 mg | INTRAMUSCULAR | Status: DC | PRN

## 2017-06-18 MED ORDER — KCL IN DEXTROSE-NACL 20-5-0.45 MEQ/L-%-% IV SOLN
INTRAVENOUS | Status: DC
  Administered 2017-06-18: 12:00:00 via INTRAVENOUS
  Administered 2017-06-19 (×2): 50 mL/h via INTRAVENOUS
  Filled 2017-06-18 (×3): qty 1000

## 2017-06-18 MED ORDER — NON FORMULARY: 1.0000 [IU] | Freq: Three times a day (TID) | Status: DC

## 2017-06-18 MED ORDER — KETOROLAC TROMETHAMINE 30 MG/ML IJ SOLN
30.0000 mg | Freq: Four times a day (QID) | INTRAMUSCULAR | Status: AC
  Administered 2017-06-18 – 2017-06-19 (×4): 30 mg via INTRAVENOUS
  Filled 2017-06-18 (×3): qty 1

## 2017-06-18 MED ORDER — OXYCODONE HCL 5 MG PO TABS
5.0000 mg | ORAL_TABLET | ORAL | Status: DC | PRN
  Administered 2017-06-18: 5 mg via ORAL
  Filled 2017-06-18 (×2): qty 1

## 2017-06-18 MED ORDER — PROPOFOL 10 MG/ML IV BOLUS
INTRAVENOUS | Status: DC | PRN
  Administered 2017-06-18: 110 mg via INTRAVENOUS

## 2017-06-18 MED ORDER — MIDAZOLAM HCL 2 MG/2ML IJ SOLN
INTRAMUSCULAR | Status: AC
  Filled 2017-06-18: qty 2

## 2017-06-18 MED ORDER — MIDAZOLAM HCL 5 MG/5ML IJ SOLN
INTRAMUSCULAR | Status: DC | PRN
  Administered 2017-06-18: 2 mg via INTRAVENOUS

## 2017-06-18 MED ORDER — STERILE WATER FOR IRRIGATION IR SOLN
Status: DC | PRN
  Administered 2017-06-18: 1000 mL

## 2017-06-18 MED ORDER — ROCURONIUM BROMIDE 10 MG/ML (PF) SYRINGE
PREFILLED_SYRINGE | INTRAVENOUS | Status: DC | PRN
  Administered 2017-06-18: 40 mg via INTRAVENOUS
  Administered 2017-06-18: 10 mg via INTRAVENOUS

## 2017-06-18 MED ORDER — DEXAMETHASONE SODIUM PHOSPHATE 10 MG/ML IJ SOLN
INTRAMUSCULAR | Status: DC | PRN
  Administered 2017-06-18: 10 mg via INTRAVENOUS

## 2017-06-18 MED ORDER — BUPIVACAINE LIPOSOME 1.3 % IJ SUSP
20.0000 mL | Freq: Once | INTRAMUSCULAR | Status: AC
  Administered 2017-06-18: 20 mL
  Filled 2017-06-18: qty 20

## 2017-06-18 MED ORDER — HYDROMORPHONE HCL 2 MG/ML IJ SOLN
INTRAMUSCULAR | Status: AC
  Filled 2017-06-18: qty 1

## 2017-06-18 MED ORDER — SENNOSIDES-DOCUSATE SODIUM 8.6-50 MG PO TABS
2.0000 | ORAL_TABLET | Freq: Every day | ORAL | Status: DC
  Administered 2017-06-18 – 2017-06-19 (×2): 2 via ORAL
  Filled 2017-06-18 (×2): qty 2

## 2017-06-18 MED ORDER — BUPIVACAINE HCL 0.25 % IJ SOLN
INTRAMUSCULAR | Status: DC | PRN
  Administered 2017-06-18: 20 mL

## 2017-06-18 MED ORDER — PREGABALIN 75 MG PO CAPS
75.0000 mg | ORAL_CAPSULE | Freq: Two times a day (BID) | ORAL | Status: DC
  Administered 2017-06-19 – 2017-06-20 (×3): 75 mg via ORAL
  Filled 2017-06-18 (×3): qty 1

## 2017-06-18 MED ORDER — ONDANSETRON HCL 4 MG/2ML IJ SOLN
INTRAMUSCULAR | Status: DC | PRN
  Administered 2017-06-18: 4 mg via INTRAVENOUS

## 2017-06-18 MED ORDER — CHEWING GUM (ORBIT) SUGAR FREE
1.0000 | CHEWING_GUM | Freq: Three times a day (TID) | ORAL | Status: DC
  Administered 2017-06-18 – 2017-06-20 (×5): 1 via ORAL
  Filled 2017-06-18: qty 1

## 2017-06-18 MED ORDER — LACTATED RINGERS IV SOLN
INTRAVENOUS | Status: DC | PRN
  Administered 2017-06-18 (×2): via INTRAVENOUS

## 2017-06-18 MED ORDER — SODIUM CHLORIDE 0.9 % IJ SOLN
INTRAMUSCULAR | Status: AC
  Filled 2017-06-18: qty 50

## 2017-06-18 MED ORDER — SODIUM CHLORIDE 0.9 % IJ SOLN
INTRAMUSCULAR | Status: AC
  Filled 2017-06-18: qty 20

## 2017-06-18 MED ORDER — FENTANYL CITRATE (PF) 250 MCG/5ML IJ SOLN
INTRAMUSCULAR | Status: AC
  Filled 2017-06-18: qty 5

## 2017-06-18 MED ORDER — IBUPROFEN 200 MG PO TABS
600.0000 mg | ORAL_TABLET | Freq: Four times a day (QID) | ORAL | Status: DC
  Administered 2017-06-19 – 2017-06-20 (×3): 600 mg via ORAL
  Filled 2017-06-18 (×4): qty 3

## 2017-06-18 MED ORDER — PROPOFOL 10 MG/ML IV BOLUS
INTRAVENOUS | Status: AC
  Filled 2017-06-18: qty 20

## 2017-06-18 MED ORDER — HYDROMORPHONE HCL 1 MG/ML IJ SOLN
INTRAMUSCULAR | Status: AC
  Filled 2017-06-18: qty 1

## 2017-06-18 MED ORDER — DEXAMETHASONE SODIUM PHOSPHATE 10 MG/ML IJ SOLN
INTRAMUSCULAR | Status: AC
  Filled 2017-06-18: qty 1

## 2017-06-18 MED ORDER — HYDROMORPHONE HCL 1 MG/ML IJ SOLN
INTRAMUSCULAR | Status: DC | PRN
  Administered 2017-06-18: 0.5 mg via INTRAVENOUS

## 2017-06-18 MED ORDER — SCOPOLAMINE 1 MG/3DAYS TD PT72
MEDICATED_PATCH | TRANSDERMAL | Status: DC | PRN
  Administered 2017-06-18: 1 via TRANSDERMAL

## 2017-06-18 MED ORDER — FENTANYL CITRATE (PF) 100 MCG/2ML IJ SOLN
INTRAMUSCULAR | Status: DC | PRN
  Administered 2017-06-18 (×3): 50 ug via INTRAVENOUS
  Administered 2017-06-18: 100 ug via INTRAVENOUS

## 2017-06-18 MED ORDER — ONDANSETRON HCL 4 MG/2ML IJ SOLN
INTRAMUSCULAR | Status: AC
  Filled 2017-06-18: qty 2

## 2017-06-18 MED ORDER — SODIUM CHLORIDE 0.9 % IJ SOLN
INTRAMUSCULAR | Status: DC | PRN
  Administered 2017-06-18: 60 mL

## 2017-06-18 MED ORDER — ONDANSETRON HCL 4 MG PO TABS: 4.0000 mg | ORAL_TABLET | Freq: Four times a day (QID) | ORAL | Status: DC | PRN

## 2017-06-18 MED ORDER — ROCURONIUM BROMIDE 10 MG/ML (PF) SYRINGE
PREFILLED_SYRINGE | INTRAVENOUS | Status: AC
  Filled 2017-06-18: qty 5

## 2017-06-18 MED ORDER — HYDROMORPHONE HCL 1 MG/ML IJ SOLN: 0.5000 mg | INTRAMUSCULAR | Status: DC | PRN

## 2017-06-18 MED ORDER — ENSURE ENLIVE PO LIQD
237.0000 mL | Freq: Two times a day (BID) | ORAL | Status: DC
  Administered 2017-06-19 – 2017-06-20 (×3): 237 mL via ORAL

## 2017-06-18 MED ORDER — SUGAMMADEX SODIUM 200 MG/2ML IV SOLN
INTRAVENOUS | Status: AC
  Filled 2017-06-18: qty 2

## 2017-06-18 MED ORDER — 0.9 % SODIUM CHLORIDE (POUR BTL) OPTIME
TOPICAL | Status: DC | PRN
  Administered 2017-06-18: 2000 mL

## 2017-06-18 MED ORDER — KETOROLAC TROMETHAMINE 30 MG/ML IJ SOLN
30.0000 mg | Freq: Four times a day (QID) | INTRAMUSCULAR | Status: AC
  Filled 2017-06-18: qty 1

## 2017-06-18 MED ORDER — SCOPOLAMINE 1 MG/3DAYS TD PT72
MEDICATED_PATCH | TRANSDERMAL | Status: AC
  Filled 2017-06-18: qty 1

## 2017-06-18 MED ORDER — HYDROMORPHONE HCL 1 MG/ML IJ SOLN
0.2500 mg | INTRAMUSCULAR | Status: DC | PRN
  Administered 2017-06-18 (×2): 0.5 mg via INTRAVENOUS

## 2017-06-18 SURGICAL SUPPLY — 61 items
ATTRACTOMAT 16X20 MAGNETIC DRP (DRAPES) ×4 IMPLANT
BLADE EXTENDED COATED 6.5IN (ELECTRODE) ×4 IMPLANT
CELLS DAT CNTRL 66122 CELL SVR (MISCELLANEOUS) IMPLANT
CHLORAPREP W/TINT 26ML (MISCELLANEOUS) ×4 IMPLANT
CLIP VESOCCLUDE LG 6/CT (CLIP) IMPLANT
CLIP VESOCCLUDE MED 6/CT (CLIP) IMPLANT
CLIP VESOCCLUDE MED LG 6/CT (CLIP) IMPLANT
CONT SPEC 4OZ CLIKSEAL STRL BL (MISCELLANEOUS) IMPLANT
COVER SURGICAL LIGHT HANDLE (MISCELLANEOUS) ×4 IMPLANT
DERMABOND ADVANCED (GAUZE/BANDAGES/DRESSINGS) ×4
DERMABOND ADVANCED .7 DNX12 (GAUZE/BANDAGES/DRESSINGS) ×4 IMPLANT
DRAPE INCISE IOBAN 66X45 STRL (DRAPES) IMPLANT
DRAPE WARM FLUID 44X44 (DRAPE) ×4 IMPLANT
DRSG OPSITE POSTOP 4X10 (GAUZE/BANDAGES/DRESSINGS) ×8 IMPLANT
DRSG OPSITE POSTOP 4X6 (GAUZE/BANDAGES/DRESSINGS) IMPLANT
DRSG OPSITE POSTOP 4X8 (GAUZE/BANDAGES/DRESSINGS) IMPLANT
ELECT REM PT RETURN 15FT ADLT (MISCELLANEOUS) ×4 IMPLANT
GAUZE 4X4 16PLY RFD (DISPOSABLE) ×4 IMPLANT
GLOVE BIO SURGEON STRL SZ 6 (GLOVE) IMPLANT
GLOVE BIO SURGEON STRL SZ 6.5 (GLOVE) ×12 IMPLANT
GLOVE BIO SURGEON STRL SZ7 (GLOVE) ×8 IMPLANT
GLOVE BIO SURGEONS STRL SZ 6.5 (GLOVE) ×4
GOWN STRL REUS W/ TWL LRG LVL3 (GOWN DISPOSABLE) ×2 IMPLANT
GOWN STRL REUS W/TWL LRG LVL3 (GOWN DISPOSABLE) ×6 IMPLANT
GOWN STRL REUS W/TWL XL LVL3 (GOWN DISPOSABLE) ×12 IMPLANT
HEMOSTAT ARISTA ABSORB 3G PWDR (MISCELLANEOUS) IMPLANT
KIT BASIN OR (CUSTOM PROCEDURE TRAY) ×4 IMPLANT
LIGASURE IMPACT 36 18CM CVD LR (INSTRUMENTS) IMPLANT
LOOP VESSEL MAXI BLUE (MISCELLANEOUS) IMPLANT
NEEDLE HYPO 22GX1.5 SAFETY (NEEDLE) ×8 IMPLANT
PACK GENERAL/GYN (CUSTOM PROCEDURE TRAY) ×4 IMPLANT
RELOAD PROXIMATE 75MM BLUE (ENDOMECHANICALS) IMPLANT
RELOAD PROXIMATE TA60MM BLUE (ENDOMECHANICALS) IMPLANT
RETRACTOR WND ALEXIS 25 LRG (MISCELLANEOUS) ×2 IMPLANT
RTRCTR WOUND ALEXIS 18CM MED (MISCELLANEOUS)
RTRCTR WOUND ALEXIS 25CM LRG (MISCELLANEOUS) ×4
SHEET LAVH (DRAPES) ×4 IMPLANT
SPOGE SURGIFLO 8M (HEMOSTASIS)
SPONGE LAP 18X18 RF (DISPOSABLE) IMPLANT
SPONGE SURGIFLO 8M (HEMOSTASIS) IMPLANT
STAPLER GUN LINEAR PROX 60 (STAPLE) IMPLANT
STAPLER PROXIMATE 75MM BLUE (STAPLE) IMPLANT
STAPLER VISISTAT 35W (STAPLE) IMPLANT
SUT MNCRL AB 4-0 PS2 18 (SUTURE) ×8 IMPLANT
SUT PDS AB 1 TP1 96 (SUTURE) ×8 IMPLANT
SUT SILK 3 0 SH CR/8 (SUTURE) IMPLANT
SUT VIC AB 0 CT1 36 (SUTURE) ×20 IMPLANT
SUT VIC AB 2-0 CT1 36 (SUTURE) ×16 IMPLANT
SUT VIC AB 2-0 CT2 27 (SUTURE) ×56 IMPLANT
SUT VIC AB 2-0 SH 27 (SUTURE) ×2
SUT VIC AB 2-0 SH 27X BRD (SUTURE) ×2 IMPLANT
SUT VIC AB 3-0 CTX 36 (SUTURE) IMPLANT
SUT VIC AB 3-0 SH 18 (SUTURE) IMPLANT
SUT VIC AB 3-0 SH 27 (SUTURE) ×2
SUT VIC AB 3-0 SH 27X BRD (SUTURE) ×2 IMPLANT
SUT VIC AB 4-0 PS2 18 (SUTURE) ×8 IMPLANT
SYR 30ML LL (SYRINGE) ×8 IMPLANT
TOWEL OR 17X26 10 PK STRL BLUE (TOWEL DISPOSABLE) ×4 IMPLANT
TOWEL OR NON WOVEN STRL DISP B (DISPOSABLE) ×4 IMPLANT
TRAY FOLEY CATH 14FR (SET/KITS/TRAYS/PACK) ×4 IMPLANT
UNDERPAD 30X30 (UNDERPADS AND DIAPERS) ×4 IMPLANT

## 2017-06-18 NOTE — Op Note (Signed)
Hysterectomy Procedure Note  Indications: Uterine fibroids, abnormal uterine bleeding  Pre-operative Diagnosis: See above  Post-operative Diagnosis: See above  Operation: Total abdominal hysterectomy, bilateral salpingectomies  Surgeon: [name], MD   Assistants: Precious Haws, MD  Anesthesia: General endotracheal anesthesia  Findings: Uterus with diffuse fibroid involvement approximately 14 weeks size in aggregate.  Adhesions of the sigmoid colon to the left adnexa and posterior uterine serosa.    Procedure Details  The patient was seen in the Holding Room. The risks, benefits, complications, treatment options, and expected outcomes were discussed with the patient.  The patient concurred with the proposed plan, giving informed consent.  The site of surgery properly noted/marked. The patient was taken to Operating Room # 5, identified as Donna Orozco and the procedure verified as Total abdominal hysterectomy. A Time Out was held and the above information confirmed.  The patient was brought to the operating room and after satisfactory attainment of general anesthesia was placed in a modified lithotomy position in [place]. The anterior abdominal wall, perineum and vagina were prepped.  A Foley catheter was inserted the patient was draped. Antibiotics were administered. The abdomen was entered through a midline incision. The upper abdomen and the pelvis were explored with the findings noted below. An Alexis and Bookwalter retractors were positioned and the small bowel packed out of the pelvis. The above noted adhesions were divided with sharp and blunt dissection.  The left retroperitoneal space was opened. The utero ovarian ligament and fallopian tube pedicle was skeletonized.  The left utero-ovarian ligament and proximal fallopian tube were clamped, cut. A free ligature was placed around the pedicle.  The pedicle was then suture ligated.   Attention was turned to the right  side of the pelvis. The right retroperitoneal space was opened. The utero ovarian ligament and fallopian tube pedicle was skeletonized.The right utero-ovarian ligament and proximal fallopian tube were clamped and cut. A free ligature was placed around the pedicle.  The pedicle was then suture ligated. The bladder flap was advanced with sharp and blunt dissection. In a stepwise fashion the uterine vessels were skeletonized clamped cut and suture ligated.  At this point the uterine fundus was amputated above the uterine vessel pedicles.   The cardinal ligaments and paracervical tissues were clamped, cut and suture ligated. The vaginal angles were crossclamped and the vagina transected from its connection to the cervix. The vaginal angles were transfixed with 0 Vicryl. The central portion vagina closed with interrupted sutures of 0 Vicryl. The left mesosalpinx was clamped and transected. The left fallopian tube was excised. The pedicle was suture ligated with 2-0 Vicryl.  The right fallopian tube was excised in a similar fashion. The pelvis was irrigated. The anterior abdominal wall was closed in layers. First a running mass closure using 0- PDS incorporated the parieto peritoneum and fascia.  The subcutaneous tissue was irrigated and hemostasis was achieved with cautery.  The subcutaneous tissue was infiltrated with a dilute Exparel solution.  The skin was closed in subcuticular fashion. A dressing was applied. The sponge, needle and instrument counts were correct x 2.  The patient was awakened from anesthesia and taken to recovery in satisfactory condition.    Estimated Blood Loss:  300 mL         Drains:  None         Total IV Fluids: per Anesthesiology         Specimens: Uterus, cervix, bilateral fallopian tubes  Implants: None         Complications:  None; patient tolerated the procedure well.         Disposition: PACU - hemodynamically stable.         Condition: stable

## 2017-06-18 NOTE — Interval H&P Note (Signed)
History and Physical Interval Note:  06/18/2017 7:19 AM  Donna Orozco  has presented today for surgery, with the diagnosis of FIBROIDS  The various methods of treatment have been discussed with the patient and family. After consideration of risks, benefits and other options for treatment, the patient has consented to  Procedure(s): EXPLORATORY LAPAROTOMY (N/A) TOTAL HYSTERECTOMY ABDOMINAL (N/A) BILATERAL SALPINGECTOMY (Bilateral) as a surgical intervention .  The patient's history has been reviewed, patient examined, no change in status, stable for surgery.  I have reviewed the patient's chart and labs.  Questions were answered to the patient's satisfaction.     [name]

## 2017-06-18 NOTE — Anesthesia Procedure Notes (Signed)
Procedure Name: Intubation Date/Time: 06/18/2017 7:41 AM Performed by: Anne Fu, CRNA Pre-anesthesia Checklist: Patient identified, Emergency Drugs available, Suction available, Patient being monitored and Timeout performed Patient Re-evaluated:Patient Re-evaluated prior to induction Oxygen Delivery Method: Circle system utilized Preoxygenation: Pre-oxygenation with 100% oxygen Induction Type: IV induction Ventilation: Mask ventilation without difficulty Laryngoscope Size: Mac and 4 Grade View: Grade I Tube type: Oral Tube size: 7.5 mm Number of attempts: 1 Airway Equipment and Method: Stylet Placement Confirmation: ETT inserted through vocal cords under direct vision,  positive ETCO2 and breath sounds checked- equal and bilateral Secured at: 21 cm Tube secured with: Tape Dental Injury: Teeth and Oropharynx as per pre-operative assessment

## 2017-06-18 NOTE — Anesthesia Postprocedure Evaluation (Signed)
Anesthesia Post Note  Patient: Donna Orozco  Procedure(s) Performed: EXPLORATORY LAPAROTOMY (N/A ) TOTAL HYSTERECTOMY ABDOMINAL (N/A ) BILATERAL SALPINGECTOMY (Bilateral )     Patient location during evaluation: PACU Anesthesia Type: General Level of consciousness: awake and alert Pain management: pain level controlled Vital Signs Assessment: post-procedure vital signs reviewed and stable Respiratory status: spontaneous breathing, nonlabored ventilation, respiratory function stable and patient connected to nasal cannula oxygen Cardiovascular status: blood pressure returned to baseline and stable Postop Assessment: no apparent nausea or vomiting Anesthetic complications: no    Last Vitals:  Vitals:   06/18/17 1251 06/18/17 1334  BP: 115/81 120/89  Pulse: 88 81  Resp: 16 13  Temp: 37.2 C 36.9 C  SpO2:  100%    Last Pain:  Vitals:   06/18/17 1334  TempSrc: Oral  PainSc:                  Tiajuana Amass

## 2017-06-18 NOTE — Anesthesia Procedure Notes (Signed)
Performed by: Dawnita Molner M, CRNA       

## 2017-06-18 NOTE — Transfer of Care (Signed)
Immediate Anesthesia Transfer of Care Note  Patient: Donna Orozco  Procedure(s) Performed: Procedure(s): EXPLORATORY LAPAROTOMY (N/A) TOTAL HYSTERECTOMY ABDOMINAL (N/A) BILATERAL SALPINGECTOMY (Bilateral)  Patient Location: PACU  Anesthesia Type:General  Level of Consciousness:  sedated, patient cooperative and responds to stimulation  Airway & Oxygen Therapy:Patient Spontanous Breathing and Patient connected to face mask oxgen  Post-op Assessment:  Report given to PACU RN and Post -op Vital signs reviewed and stable  Post vital signs:  Reviewed and stable  Last Vitals:  Vitals:   06/18/17 0552  BP: 129/88  Pulse: 94  Resp: 18  Temp: 36.8 C  SpO2: 14%    Complications: No apparent anesthesia complications

## 2017-06-19 LAB — CBC
HEMATOCRIT: 28.3 % — AB (ref 36.0–46.0)
HEMOGLOBIN: 8.5 g/dL — AB (ref 12.0–15.0)
MCH: 21.6 pg — AB (ref 26.0–34.0)
MCHC: 30 g/dL (ref 30.0–36.0)
MCV: 71.8 fL — AB (ref 78.0–100.0)
Platelets: 311 10*3/uL (ref 150–400)
RBC: 3.94 MIL/uL (ref 3.87–5.11)
RDW: 18.6 % — ABNORMAL HIGH (ref 11.5–15.5)
WBC: 10.8 10*3/uL — ABNORMAL HIGH (ref 4.0–10.5)

## 2017-06-19 NOTE — Progress Notes (Addendum)
1 Day Post-Op Procedure(s) (LRB): EXPLORATORY LAPAROTOMY (N/A) TOTAL HYSTERECTOMY ABDOMINAL (N/A) BILATERAL SALPINGECTOMY (Bilateral)  Subjective: Patient reports tolerating breakfast with no nausea or emesis reported.  Due to void since foley removal.  Reporting intermittent back pain but decline kpad at this time.  Abdominal pain relieved with PRN medications.  No flatus or BM reported.  Denies chest pain, dyspnea.  Has plans to increase her ambulation today.  She reports mild throat soreness with feeling that her food is not moving down fast enough when eating.  No other concerns voiced.       Objective: Vital signs in last 24 hours: Temp:  [97.8 F (36.6 C)-99.6 F (37.6 C)] 98.3 F (36.8 C) (05/03 0531) Pulse Rate:  [70-99] 79 (05/03 0531) Resp:  [10-18] 18 (05/03 0531) BP: (101-133)/(65-89) 101/72 (05/03 0531) SpO2:  [98 %-100 %] 99 % (05/03 0531) Last BM Date: 06/17/17  Intake/Output from previous day: 05/02 0701 - 05/03 0700 In: 3920 [I.V.:2020] Out: 700 [Urine:500; Blood:200]  Physical Examination: General: alert, cooperative and no distress Resp: clear to auscultation bilaterally Cardio: regular rate and rhythm, S1, S2 normal, no murmur, click, rub or gallop GI: incision: midline incision with op site dressing in place with no drainage present and abdomen soft, active bowel sounds, slightly tympanic Extremities: extremities normal, atraumatic, no cyanosis or edema  Labs: WBC/Hgb/Hct/Plts:  10.8/8.5/28.3/311 (05/03 0454)    Assessment: 37 y.o. s/p Procedure(s): EXPLORATORY LAPAROTOMY TOTAL HYSTERECTOMY ABDOMINAL BILATERAL SALPINGECTOMY: stable Pain:  Pain is well-controlled on PRN medications.  Heme: Hgb 8.5 and Hct 28.3 this am. Appropriate compared to pre-op hgb of 9.7 and hct 32.7 with operative blood loss.  CV: BP and HR stable post-operatively.  Continue to monitor with ordered vital signs.  GI:  Tolerating po: Yes. Antiemetics ordered PRN.  GU: Due to  void since foley removal.   Prophylaxis: SCDs  Plan: Diet as tolerated Saline lock later today if diet tolerated Encourage ambulation, IS use, deep breathing, and coughing Continue plan of care per Dr. Delsa Sale    LOS: 1 day    Donna Orozco 06/19/2017, 10:44 AM

## 2017-06-20 MED ORDER — OXYCODONE HCL 5 MG PO TABS: 5.0000 mg | ORAL_TABLET | ORAL | 0 refills | Status: AC | PRN

## 2017-06-20 NOTE — Discharge Summary (Signed)
Physician Discharge Summary  Patient ID: Donna Orozco MRN: [id number] DOB/AGE: [birth year] 37 y.o.  Admit date: 06/18/2017 Discharge date: 06/20/2017  Admission Diagnoses:  Discharge Diagnoses:  Active Problems:   Fibroids, intramural   Discharged Condition: good  Hospital Course: On 06/18/2017, the patient underwent the following: Procedure(s): EXPLORATORY LAPAROTOMY TOTAL HYSTERECTOMY ABDOMINAL BILATERAL SALPINGECTOMY.   The postoperative course was uneventful.  Her Hgb on POD#1 was consistent with the intraoperative blood loss; she has a history of chronic iron-deficiency anemia.  She was discharged to home on postoperative day 2 tolerating a regular diet, meeting all discharge criteria.  Consults: None  Significant Diagnostic Studies: None  Treatments: surgery: see above  Discharge Exam: Blood pressure 111/80, pulse 80, temperature 98 F (36.7 C), temperature source Oral, resp. rate 14, height 5\' 9"  (1.753 m), weight 162 lb (73.5 kg), last menstrual period 09/18/2016, SpO2 99 %. General appearance: alert Resp: clear to auscultation bilaterally Cardio: regular rate and rhythm, S1, S2 normal, no murmur, click, rub or gallop GI: soft, non-tender; bowel sounds normal; no masses,  no organomegaly Extremities: extremities normal, atraumatic, no cyanosis or edema and Homans sign is negative, no sign of DVT Incision/Wound: C/D/I  Disposition: Discharge disposition: 01-Home or Self Care       Discharge Instructions    Activity as tolerated - No restrictions   Complete by:  As directed    Call MD for:  extreme fatigue   Complete by:  As directed    Call MD for:  persistant dizziness or light-headedness   Complete by:  As directed    Call MD for:  persistant nausea and vomiting   Complete by:  As directed    Call MD for:  redness, tenderness, or signs of infection (pain, swelling, redness, odor or green/yellow discharge around incision site)   Complete by:  As directed     Call MD for:  severe uncontrolled pain   Complete by:  As directed    Call MD for:  temperature >100.4   Complete by:  As directed    Diet general   Complete by:  As directed    Discharge instructions   Complete by:  As directed    Planning for Recovery and Going Home Your Guide to Gynecologic Surgery    In-Hospital Recovery Plan Team Caring for You After Surgery In addition to the nursing staff on the unit, the gynecological surgery team will care for you. This team is led by your surgeon and includes medical students and a physician assistant or nurse practitioner. There will be a physician in the hospital 24 hours a day to tend to your needs. The students report directly to your surgeon, who is the one overseeing all of your care.  Pain Relief After Surgery Your pain will be assessed regularly on a scale from 0 to 10. Pain assessment is  necessary to guide your pain relief. It is essential that you are able to take deep breaths, cough and move. Prevention or early treatment of pain is far more effective than trying to treat severe pain. Therefore, we have devised a specialized regimen to stay ahead of your pain and use almost no narcotics, which can slow down your recovery process. If you have an epidural catheter, you will receive a  constant infusion of pain medication through your epidural. If you need additional pain relief, you will be able to push a button to increase the medication in your epidural. You will also be given acetaminophen and an  ibuprofen-like medication to keep your pain under control.  You can always ask for additional pain pills if you are not comfortable. In most cases an anesthesiologist with expertise in pain management will visit you every day and help design your pain management plan.  One Day After Surgery Focus on drinking and walking. You will start drinking clear liquids after surgery. The intravenous fluids will be stopped, and the catheter  may be removed  from your bladder. We expect you to get out of bed, with the nurses' or assistants' help, sit in a chair for six hours and start to move about in the hallways. You will also meet with a case manager to assess your discharge needs, including home nursing. Your physician may order home care to assist with your transition home.  Home nursing visits, which are intermittent, help you get readjusted to home by teaching treatments, monitoring medications, and performing clinical assessment and reporting back to your physician. Other services may include therapy and medical equipment; private duty services are also available. If you are going "home" to a different address upon discharge, please alert [place]. A Home Care Coordinator can visit with you while in the hospital to discuss your options. If you have questions please speak with your case manager. If you need rehabilitation at a facility, a social worker will assist with this. If you need rehabilitation at a facility, a social worker will assist with this. If your procedure was performed in a minimally invasive fashion, you will be discharged to home if your pain is well controlled and you are tolerating a regular diet.     Two Days After Surgery You will start eating a soft diet and change to a more solid diet as you feel up to it. The catheter from your bladder will be removed, if not already done so. If there is a dressing on your wound, it will be removed. The tubing will be disconnected from your IV. We expect you to be out of bed for the majority of the day and walking at least three times in the hallway, with assistance as needed.  You may be discharged at this point if it is felt you are ready.   Three Days After Surgery You continue to eat your low residue diet. You may be ready to go home if you are drinking enough to keep yourself hydrated, your pain is well controlled, you are not belching or nauseated, you are passing  gas and you are able to get around on your own. However, we will not discharge you from the hospital until we are sure you are ready.  Discharge Discharge time is at 10 a.m. You will need to make arrangements for someone to accompany you home. You will not be released without someone present. Please keep in mind that we strive to get patients discharged as quickly as possible, but there may be delays for a variety of reasons. Complications That May Delay Discharge: ? Nausea and vomiting: It is very common to feel sick after your surgery. We give you medication to reduce this. However, if you do feel sick, you should reduce the amount you are taking by mouth. Small, frequent meals or drinks are best in this  situation. As long as you can drink and keep yourself hydrated, the nausea will likely pass.  Ileus: Following surgery, the bowel can be sluggish, making it difficult for food and gas to pass through the intestines. This is called an ileus. We have designed  our care program to do everything possible to reduce the likelihood of an ileus. If you do develop an ileus, it usually only lasts two to three days. However, it may require a small tube down the nose to decompress the stomach. The best way to avoid an ileus is to reduce the amount of narcotic pain medications, get up as much as possible after your surgery, and stimulate the bowel early after surgery with small amounts of food and liquids.  Wound infection: If a wound infection develops, this usually happens three to ten days after surgery.    Urinary retention: This is if you are unable to urinate after the catheter from your bladder is removed. The catheter may need to be reinserted until you are able to urinate on your own. This can be caused by anesthesia, pain medication and decreased activity.    When you are preparing to go home, you will receive:  Detailed discharge instructions, with information about your  operation and medications    All prescriptions for medications you need at home; prescriptions can be filled while you are in the hospital if you would like    You may be prescribed Lovenox. Lovenox is used to reduce the risk of developing a blood clot after surgery. An appointment to see your surgeon or provider one to two weeks after you leave the hospital for follow-up   After Discharge Once you are discharged: Call us at any time if you are worried about your recovery or if you should have any questions. During regular office hours, (8:30 a.m.-4:30 p.m.), and after hours call [phone].  Call us immediately if:  You have a fever higher than 100.4 degrees.   Your wound is red, more painful or has drainage.    You are nauseated, vomiting or can't keep liquids down.    Your pain is worse and not able to be controlled with the regimen you were sent home with.    If you are bleeding heavily or have a lot of fluid coming from your vagina. If you are on narcotics, the goal is to wean you off of them. If you are running low on supply and need more, call the nurse a few days before you will run out.  It is generally easier to reach someone between 8:30 a.m. - 4:30 p.m., so call early if you think something is not right. A nurse or nurse practitioner is available every day to answer your questions. After hours and on the weekends, the calls go to the resident doctors in the hospital. It may take longer for your phone call to be returned during this time. If you have a true emergency, such as severe abdominal pain, chest pain, shortness of breath or any other acute issues, call 911 and go to the local emergency room. Have them contact our team once you are stable.  Concerns After Discharge Bowel Function Following Your Surgery Your bowels will take several weeks to settle down and may be unpredictable at first. Your bowel movements may become loose, or you may be  constipated. For the vast number of patients, this will get back to normal with time. Make sure you eat nutritious meals, drink plenty of fluids and take regular walks during the first two weeks after your operation. Your Guide to Gynecologic Surgery   Abdominal Pain It is not unusual to suffer gripping pains (colic) during the first week following removal of a portion of your bowel. This pain usually lasts  for a few minutes but goes away between spasms. If you have severe pain lasting more than one to two hours or have a fever and feel generally unwell, you should contact us at  the telephone contact numbers listed at the end of this packet. Hysterectomy: You should have pelvic rest for six (6) weeks or as specified by your doctor after surgery. You should have nothing in the vagina (no tampons, douching, intercourse, etc.,) during this time period. If you have some vaginal spotting, this is normal. If you have heavy bleeding or  a lot of fluid from your vagina, this is NOT normal and you should contact your doctor's office or, if after hours, contact the doctor on call.  Diarrhea: Fiber and Imodium (Loperamide) The first step to improving your frequent or loose stools is to bulk up the stool with fiber. Metamucil is the most common type of fiber that is available at any drug store. Start with 1 teaspoon mixed into food, like yogurt or oatmeal, in the morning and evening. Try not to drink any fluid for one hour after you take the fiber. This will allow the fiber to act like a sponge in your intestines, soaking up all the excess water. Continue this for three to five days. You may increase by 1 teaspoon every three to five days until the desired affect, or you are at 1 tablespoon (3 teaspoons) twice a day. If this doesn't work, you may try over-the-counter Loperamide, which is an antidiarrheal medication. You may take one tablet in the morning and evening or 30 minutes before  you typically have diarrhea. You may take up to eight of these tablets daily. It is best to discuss this with [place] prior to using this medication. If you have continuous diarrhea and abdominal cramping, call [phone].  Foley Catheter Your surgeon may recommend you be discharged home with a foley catheter (bladder catheter) for 1 to 2 weeks. Typically this recommendation will be made for patients undergoing surgery to the lower urinary tract. Before you leave the hospital, your nurse should outfit you with a clip on the inner thigh to secure the catheter to prevent pulling as well as a small bag that can be easily worn on the upper leg under loose fitting pants and skirts. Your nurse will teach you how to exchange the large bag that typically comes with the catheter for the small bag. You may find it convenient to attach the small bag when active during the day and then the large bag when sleeping at night. If there is ever a point when you notice the catheter is not draining urine and youbegin to develop pain behind/above the pubic bone, you should report to the clinic or emergency room immediately as the catheter may be kinked or clogged. Kinking or clogging of the catheter prevents urine from draining from your bladder. Urine will quickly build up in the bladder and can cause severe pain as well as seriously disrupt healing if you have undergone surgery on the lower urinary tract. Additionally, pulling on the catheter can result in displacement of the balloon at the end of the catheter from inside of to outside  of the bladder. This also results in severe pain and can cause bleeding. For this reason, secure the catheter to the clip on your inner thigh at all times as the clip prevents against pulling.  Wound Care For the first few weeks following surgery, your wound may be slightly red and uncomfortable. You  may shower and let the soapy water wash over your incision. Avoid soaking in the  tub for one month following surgery or until the wound is well healed. It will take the wound several months to "soften." It is common to have bumpy areas in the wound near the belly  button and at the ends of the incision.  If you have staples, these should be removed when you are seen by your surgeon at the follow-up appointment. You may have a glue-like material on your incision. Do not pick at this. It will come off over time. It is the surgical glue used in surgery to close your incision. You also have sutures inside of you that will dissolve over time  Post-Surgery Diet Attention to good nutrition after surgery is important to your recovery. If you had no dietary restrictions prior to the surgery, you will have no special dietary restrictions after the surgery. However, consuming enough protein, calories, vitamins and minerals is necessary to support healing. Some patients find their appetite is less than normal after surgery. In this case, frequent small meals throughout the day may help. It is not uncommon to lose 10 to 15 pounds after surgery. However, by the fourth to fifth week, your weight loss should stabilize. It is normal that certain foods taste different and certain smells may make you nauseas. Over time, the amount you can comfortably consume will gradually increase. You should try to eat a balanced diet, which includes:  Foods that are soft, moist, and easy to chew and swallow    Canned or soft-cooked fruits and vegetables   Plenty of soft breads, rice, pasta, potatoes and other starchy foods (lowerfiber  varieties may be tolerated better initially)    High-protein foods and beverages, such as meats, eggs, milk, cottage cheese  or a supplemental nutrition drink like Boost or Ensure    Drink plenty of fluids-at least 8 to 10 cups per day. This includes water,  fruit juice, Gatorade, teas/coffee and milk. Drinking plenty is especially important if you have loose  stools (diarrhea).   Avoid drinking a lot of caffeine, since this may dehydrate you.    Avoid fried, greasy and highly seasoned or spicy foods.    Avoid carbonated beverages in the first couple weeks.    Avoid raw fruits and vegetables.   Hobbies/Activities Walking is encouraged after your surgery. You should plan to undertake regular exercise several times a day and gradually increase this during the four weeks following your operation until you are back to your normal level of activity. You may climb stairs. Don't do any heavy lifting greater than 10 pounds or contact sports for the first month after your surgery. Generally, you can return to hobbies and activities soon after your surgery. This will help you recover. It can take up to two to three months to fully recover. It is not unusual to be fatigued and require an afternoon nap for up to six to eight weeks following surgery. Your body is using this energy to heal your wounds. Set small goals for yourself and try to do a little more each day.  Work It is normal to return to work three to six weeks following your operation. If your job involves heavy manual work, then you should wait six weeks. However, you should check with your employer regarding rules, which may be relevant to your return to work. If you need a return-to-work form for your employer or disability papers, bring them to your  follow-up appointment or fax them to our office at [phone].  Driving You may drive when you are off narcotics and pain-free enough to react quickly with your braking foot. For most patients, this occurs at one to four weeks following surgery.   Write down any questions you may have to ask your care team.  Important Contact Numbers: GYN Oncology Office: [phone]   Discharge wound care:   Complete by:  As directed    Keep clean and dry   Driving Restrictions   Complete by:  As directed    No driving for 1- 2 weeks    Increase activity slowly   Complete by:  As directed    Lifting restrictions   Complete by:  As directed    No lifting > 10 lbs for 6 weeks   May shower / Bathe   Complete by:  As directed    No tub baths for 6 weeks   Sexual Activity Restrictions   Complete by:  As directed    No intercourse for 6 - 8 weeks     Allergies as of 06/20/2017   No Known Allergies     Medication List    STOP taking these medications   norethindrone 5 MG tablet Commonly known as:  AYGESTIN     TAKE these medications   acetaminophen 500 MG tablet Commonly known as:  TYLENOL Take 2 tablets (1,000 mg total) by mouth 4 (four) times daily. What changed:    when to take this  reasons to take this   GOODY HEADACHE PO Take 1 packet by mouth every 6 (six) hours as needed (for pain/HEADACHE).   ibuprofen 200 MG tablet Commonly known as:  ADVIL,MOTRIN Take 400-800 mg by mouth every 8 (eight) hours as needed (FOR PAIN).   Iron 325 (65 Fe) MG Tabs Take 325 mg by mouth daily.   oxyCODONE 5 MG immediate release tablet Commonly known as:  Oxy IR/ROXICODONE Take 1 tablet (5 mg total) by mouth every 4 (four) hours as needed for severe pain or breakthrough pain.   SALONPAS PAIN RELIEF PATCH EX Place 1 patch onto the skin daily as needed (for pain.).   WOMENS MULTIVITAMIN PLUS PO Take 1 tablet by mouth daily.            Discharge Care Instructions  (From admission, onward)        Start     Ordered   06/20/17 0000  Discharge wound care:    Comments:  Keep clean and dry   06/20/17 1059     Follow-up Information    [name], MD. Schedule an appointment as soon as possible for a visit in 2 week(s).   Specialty:  Obstetrics and Gynecology Contact information: [place] [address] [phone]           Signed: [name] 06/20/2017, 11:01 AM

## 2017-06-20 NOTE — Discharge Instructions (Signed)
Abdominal Hysterectomy, Care After °This sheet gives you information about how to care for yourself after your procedure. Your doctor may also give you more specific instructions. If you have problems or questions, contact your doctor. °Follow these instructions at home: °Bathing °· Do not take baths, swim, or use a hot tub until your doctor says it is okay. Ask your doctor if you can take showers. You may only be allowed to take sponge baths for bathing. °· Keep the bandage (dressing) dry until your doctor says it can be taken off. °Surgical cut ( °incision) care °· Follow instructions from your doctor about how to take care of your cut from surgery. Make sure you: °? Wash your hands with soap and water before you change your bandage (dressing). If you cannot use soap and water, use hand sanitizer. °? Change your bandage as told by your doctor. °? Leave stitches (sutures), skin glue, or skin tape (adhesive) strips in place. They may need to stay in place for 2 weeks or longer. If tape strips get loose and curl up, you may trim the loose edges. Do not remove tape strips completely unless your doctor says it is okay. °· Check your surgical cut area every day for signs of infection. Check for: °? Redness, swelling, or pain. °? Fluid or blood. °? Warmth. °? Pus or a bad smell. °Activity °· Do gentle, daily exercise as told by your doctor. You may be told to take short walks every day and go farther each time. °· Do not lift anything that is heavier than 10 lb (4.5 kg), or the limit that your doctor tells you, until he or she says that it is safe. °· Do not drive or use heavy machinery while taking prescription pain medicine. °· Do not drive for 24 hours if you were given a medicine to help you relax (sedative). °· Follow your doctor's advice about exercise, driving, and general activities. Ask your doctor what activities are safe for you. °Lifestyle °· Do not douche, use tampons, or have sex for at least 6 weeks or as  told by your doctor. °· Do not drink alcohol until your doctor says it is okay. °· Drink enough fluid to keep your pee (urine) clear or pale yellow. °· Try to have someone at home with you for the first 1-2 weeks to help. °· Do not use any products that contain nicotine or tobacco, such as cigarettes and e-cigarettes. These can slow down healing. If you need help quitting, ask your doctor. °General instructions °· Take over-the-counter and prescription medicines only as told by your doctor. °· Do not take aspirin or ibuprofen. These medicines can cause bleeding. °· To prevent or treat constipation while you are taking prescription pain medicine, your doctor may suggest that you: °? Drink enough fluid to keep your urine clear or pale yellow. °? Take over-the-counter or prescription medicines. °? Eat foods that are high in fiber, such as: °§ Fresh fruits and vegetables. °§ Whole grains. °§ Beans. °? Limit foods that are high in fat and processed sugars, such as fried and sweet foods. °· Keep all follow-up visits as told by your doctor. This is important. °Contact a doctor if: °· You have chills or fever. °· You have redness, swelling, or pain around your cut. °· You have fluid or blood coming from your cut. °· Your cut feels warm to the touch. °· You have pus or a bad smell coming from your cut. °· Your cut breaks   open. °· You feel dizzy or light-headed. °· You have pain or bleeding when you pee. °· You keep having watery poop (diarrhea). °· You keep feeling sick to your stomach (nauseous) or keep throwing up (vomiting). °· You have unusual fluid (discharge) coming from your vagina. °· You have a rash. °· You have a reaction to your medicine. °· Your pain medicine does not help. °Get help right away if: °· You have a fever and your symptoms get worse all of a sudden. °· You have very bad belly (abdominal) pain. °· You are short of breath. °· You pass out (faint). °· You have pain, swelling, or redness of your  leg. °· You bleed a lot from your vagina and notice clumps of blood (clots). °Summary °· Do not take baths, swim, or use a hot tub until your doctor says it is okay. Ask your doctor if you can take showers. You may only be allowed to take sponge baths for bathing. °· Follow your doctor's advice about exercise, driving, and general activities. Ask your doctor what activities are safe for you. °· Do not lift anything that is heavier than 10 lb (4.5 kg), or the limit that your doctor tells you, until he or she says that it is safe. °· Try to have someone at home with you for the first 1-2 weeks to help. °This information is not intended to replace advice given to you by your health care provider. Make sure you discuss any questions you have with your health care provider. °Document Released: 11/13/2007 Document Revised: 01/23/2016 Document Reviewed: 01/23/2016 °[employer] Patient Education © 2017 [employer] ° °

## 2017-07-03 ENCOUNTER — Ambulatory Visit: Payer: BLUE CROSS/BLUE SHIELD | Admitting: Obstetrics

## 2017-07-06 ENCOUNTER — Ambulatory Visit: Payer: BLUE CROSS/BLUE SHIELD | Admitting: Obstetrics
